# Patient Record
Sex: Male | Born: 1970 | Race: White | Hispanic: No | Marital: Married | Smoking: Former smoker
Health system: Southern US, Community
[De-identification: ages and names within clinical notes are randomized; demographics above are authoritative.]

## PROBLEM LIST (undated history)

## (undated) DIAGNOSIS — Z973 Presence of spectacles and contact lenses: Secondary | ICD-10-CM

## (undated) DIAGNOSIS — I10 Essential (primary) hypertension: Secondary | ICD-10-CM

## (undated) DIAGNOSIS — G4733 Obstructive sleep apnea (adult) (pediatric): Secondary | ICD-10-CM

## (undated) DIAGNOSIS — T7840XA Allergy, unspecified, initial encounter: Secondary | ICD-10-CM

## (undated) DIAGNOSIS — E785 Hyperlipidemia, unspecified: Secondary | ICD-10-CM

## (undated) DIAGNOSIS — N401 Enlarged prostate with lower urinary tract symptoms: Secondary | ICD-10-CM

## (undated) DIAGNOSIS — N529 Male erectile dysfunction, unspecified: Secondary | ICD-10-CM

## (undated) DIAGNOSIS — Z8619 Personal history of other infectious and parasitic diseases: Secondary | ICD-10-CM

## (undated) HISTORY — DX: Obstructive sleep apnea (adult) (pediatric): G47.33

## (undated) HISTORY — DX: Personal history of other infectious and parasitic diseases: Z86.19

## (undated) HISTORY — DX: Hyperlipidemia, unspecified: E78.5

## (undated) HISTORY — DX: Essential (primary) hypertension: I10

## (undated) HISTORY — DX: Allergy, unspecified, initial encounter: T78.40XA

---

## 2004-07-07 ENCOUNTER — Ambulatory Visit: Payer: Self-pay | Admitting: Internal Medicine

## 2005-05-28 ENCOUNTER — Ambulatory Visit: Payer: Self-pay | Admitting: Family Medicine

## 2006-01-03 ENCOUNTER — Ambulatory Visit: Payer: Self-pay | Admitting: Family Medicine

## 2006-02-02 ENCOUNTER — Ambulatory Visit: Payer: Self-pay | Admitting: Family Medicine

## 2006-04-13 ENCOUNTER — Ambulatory Visit: Payer: Self-pay | Admitting: Family Medicine

## 2006-06-10 ENCOUNTER — Encounter: Payer: Self-pay | Admitting: Internal Medicine

## 2006-06-20 ENCOUNTER — Ambulatory Visit: Payer: Self-pay | Admitting: Family Medicine

## 2006-06-20 DIAGNOSIS — I10 Essential (primary) hypertension: Secondary | ICD-10-CM

## 2006-06-20 DIAGNOSIS — E663 Overweight: Secondary | ICD-10-CM | POA: Insufficient documentation

## 2006-06-23 DIAGNOSIS — I10 Essential (primary) hypertension: Secondary | ICD-10-CM | POA: Insufficient documentation

## 2006-07-22 ENCOUNTER — Encounter (INDEPENDENT_AMBULATORY_CARE_PROVIDER_SITE_OTHER): Payer: Self-pay | Admitting: *Deleted

## 2006-07-25 ENCOUNTER — Ambulatory Visit: Payer: Self-pay | Admitting: Family Medicine

## 2006-07-25 DIAGNOSIS — F528 Other sexual dysfunction not due to a substance or known physiological condition: Secondary | ICD-10-CM

## 2006-07-28 LAB — CONVERTED CEMR LAB
Alkaline Phosphatase: 91 units/L (ref 39–117)
Calcium: 8.9 mg/dL (ref 8.4–10.5)
Chloride: 104 meq/L (ref 96–112)
Creatinine, Ser: 1.1 mg/dL (ref 0.4–1.5)
Eosinophils Absolute: 0.3 10*3/uL (ref 0.0–0.6)
Eosinophils Relative: 4.2 % (ref 0.0–5.0)
GFR calc Af Amer: 97 mL/min
Glucose, Bld: 74 mg/dL (ref 70–99)
LDL Cholesterol: 121 mg/dL — ABNORMAL HIGH (ref 0–99)
MCV: 85.2 fL (ref 78.0–100.0)
Platelets: 254 10*3/uL (ref 150–400)
Potassium: 3.5 meq/L (ref 3.5–5.1)
RBC: 5.19 M/uL (ref 4.22–5.81)
RDW: 12.7 % (ref 11.5–14.6)
Sodium: 139 meq/L (ref 135–145)
TSH: 1.39 microintl units/mL (ref 0.35–5.50)
Total Bilirubin: 1.1 mg/dL (ref 0.3–1.2)
Total CHOL/HDL Ratio: 4.5
Total Protein: 7 g/dL (ref 6.0–8.3)
Triglycerides: 77 mg/dL (ref 0–149)
VLDL: 15 mg/dL (ref 0–40)
WBC: 6 10*3/uL (ref 4.5–10.5)

## 2006-10-17 ENCOUNTER — Ambulatory Visit: Payer: Self-pay | Admitting: Family Medicine

## 2006-10-31 ENCOUNTER — Encounter (INDEPENDENT_AMBULATORY_CARE_PROVIDER_SITE_OTHER): Payer: Self-pay | Admitting: Internal Medicine

## 2006-10-31 ENCOUNTER — Ambulatory Visit: Payer: Self-pay | Admitting: Family Medicine

## 2006-10-31 ENCOUNTER — Encounter: Admission: RE | Admit: 2006-10-31 | Discharge: 2006-10-31 | Payer: Self-pay | Admitting: Sports Medicine

## 2006-10-31 DIAGNOSIS — IMO0002 Reserved for concepts with insufficient information to code with codable children: Secondary | ICD-10-CM

## 2006-11-03 ENCOUNTER — Telehealth (INDEPENDENT_AMBULATORY_CARE_PROVIDER_SITE_OTHER): Payer: Self-pay | Admitting: Internal Medicine

## 2006-11-28 ENCOUNTER — Ambulatory Visit: Payer: Self-pay | Admitting: Family Medicine

## 2007-03-07 ENCOUNTER — Ambulatory Visit: Payer: Self-pay | Admitting: Family Medicine

## 2007-03-20 ENCOUNTER — Ambulatory Visit: Payer: Self-pay | Admitting: Family Medicine

## 2007-03-20 DIAGNOSIS — J069 Acute upper respiratory infection, unspecified: Secondary | ICD-10-CM | POA: Insufficient documentation

## 2007-04-17 ENCOUNTER — Ambulatory Visit: Payer: Self-pay | Admitting: Family Medicine

## 2007-04-21 ENCOUNTER — Ambulatory Visit: Payer: Self-pay | Admitting: Internal Medicine

## 2007-04-21 DIAGNOSIS — R609 Edema, unspecified: Secondary | ICD-10-CM | POA: Insufficient documentation

## 2007-04-24 LAB — CONVERTED CEMR LAB
ALT: 46 units/L (ref 0–53)
AST: 25 units/L (ref 0–37)
Basophils Relative: 1 % (ref 0.0–1.0)
Bilirubin, Direct: 0.1 mg/dL (ref 0.0–0.3)
CO2: 26 meq/L (ref 19–32)
Calcium: 9.2 mg/dL (ref 8.4–10.5)
Chloride: 105 meq/L (ref 96–112)
Creatinine, Ser: 1 mg/dL (ref 0.4–1.5)
Eosinophils Absolute: 0.2 10*3/uL (ref 0.0–0.6)
Eosinophils Relative: 3.2 % (ref 0.0–5.0)
GFR calc non Af Amer: 89 mL/min
Glucose, Bld: 92 mg/dL (ref 70–99)
HCT: 44 % (ref 39.0–52.0)
MCV: 87 fL (ref 78.0–100.0)
Neutrophils Relative %: 49.9 % (ref 43.0–77.0)
Platelets: 233 10*3/uL (ref 150–400)
RBC: 5.05 M/uL (ref 4.22–5.81)
RDW: 12.7 % (ref 11.5–14.6)
Sodium: 138 meq/L (ref 135–145)
TSH: 0.86 microintl units/mL (ref 0.35–5.50)
Total Bilirubin: 0.9 mg/dL (ref 0.3–1.2)
Total Protein: 6.9 g/dL (ref 6.0–8.3)
WBC: 6.4 10*3/uL (ref 4.5–10.5)

## 2007-05-10 ENCOUNTER — Ambulatory Visit: Payer: Self-pay | Admitting: Family Medicine

## 2007-07-14 ENCOUNTER — Ambulatory Visit: Payer: Self-pay | Admitting: Family Medicine

## 2007-07-14 DIAGNOSIS — F329 Major depressive disorder, single episode, unspecified: Secondary | ICD-10-CM | POA: Insufficient documentation

## 2007-07-14 DIAGNOSIS — F3289 Other specified depressive episodes: Secondary | ICD-10-CM | POA: Insufficient documentation

## 2007-08-24 ENCOUNTER — Ambulatory Visit: Payer: Self-pay | Admitting: Family Medicine

## 2007-08-24 DIAGNOSIS — T6391XA Toxic effect of contact with unspecified venomous animal, accidental (unintentional), initial encounter: Secondary | ICD-10-CM | POA: Insufficient documentation

## 2007-09-11 ENCOUNTER — Telehealth (INDEPENDENT_AMBULATORY_CARE_PROVIDER_SITE_OTHER): Payer: Self-pay | Admitting: Internal Medicine

## 2007-10-17 ENCOUNTER — Emergency Department (HOSPITAL_COMMUNITY): Admission: EM | Admit: 2007-10-17 | Discharge: 2007-10-17 | Payer: Self-pay | Admitting: Emergency Medicine

## 2007-12-19 ENCOUNTER — Ambulatory Visit: Payer: Self-pay | Admitting: Family Medicine

## 2007-12-19 DIAGNOSIS — K1120 Sialoadenitis, unspecified: Secondary | ICD-10-CM

## 2009-09-15 ENCOUNTER — Encounter (INDEPENDENT_AMBULATORY_CARE_PROVIDER_SITE_OTHER): Payer: Self-pay | Admitting: *Deleted

## 2010-03-12 NOTE — Letter (Signed)
Summary: Nadara Eaton letter  [place] [facility]  [address] [place], [place] [address]   Phone: [phone]  Fax: [phone]       09/15/2009 MRN: [id number]  Juanita Nakayama 1955 [address] [place], [place]  [address]  Dear Mr. [facility] [place] and [place] announce the retirement of Arta Silence, M.D., from full-time practice at the [facility] office effective August 07, 2009 and his plans of returning part-time.  It is important to Dr. Hetty Ely and to our practice that you understand that [facility] has seven physicians in our office for your health care needs.  We will continue to offer the same exceptional care that you have today.    Dr. Hetty Ely has spoken to many of you about his plans for retirement and returning part-time in the fall.   We will continue to work with you through the transition to schedule appointments for you in the office and meet the high standards that [place] is committed to.   Again, it is with great pleasure that we share the news that Dr. Hetty Ely will return to [facility] at [facility] in October of 2011 with a reduced schedule.    If you have any questions, or would like to request an appointment with one of our physicians, please call us at [phone] and press the option for Scheduling an appointment.  We take pleasure in providing you with excellent patient care and look forward to seeing you at your next office visit.  Our [facility] Physicians are:  Tillman Abide, M.D. Laurita Quint, M.D. Roxy Manns, M.D. Kerby Nora, M.D. Hannah Beat, M.D. Ruthe Mannan, M.D. We proudly welcomed Raechel Ache, M.D. and Eustaquio Boyden, M.D. to the practice in July/August 2011.  Sincerely,   [facility]

## 2010-06-03 ENCOUNTER — Emergency Department (HOSPITAL_COMMUNITY)
Admission: EM | Admit: 2010-06-03 | Discharge: 2010-06-03 | Disposition: A | Discharge status: Home / Self Care | Payer: BC Managed Care – PPO | Attending: Emergency Medicine | Admitting: Emergency Medicine

## 2010-06-03 DIAGNOSIS — F411 Generalized anxiety disorder: Secondary | ICD-10-CM | POA: Insufficient documentation

## 2010-06-03 DIAGNOSIS — Y929 Unspecified place or not applicable: Secondary | ICD-10-CM | POA: Insufficient documentation

## 2010-06-03 DIAGNOSIS — I10 Essential (primary) hypertension: Secondary | ICD-10-CM | POA: Insufficient documentation

## 2010-06-03 DIAGNOSIS — R220 Localized swelling, mass and lump, head: Secondary | ICD-10-CM | POA: Insufficient documentation

## 2010-06-03 DIAGNOSIS — Z79899 Other long term (current) drug therapy: Secondary | ICD-10-CM | POA: Insufficient documentation

## 2010-06-03 DIAGNOSIS — R209 Unspecified disturbances of skin sensation: Secondary | ICD-10-CM | POA: Insufficient documentation

## 2010-06-03 DIAGNOSIS — T7840XA Allergy, unspecified, initial encounter: Secondary | ICD-10-CM | POA: Insufficient documentation

## 2010-06-03 LAB — POCT I-STAT, CHEM 8
Calcium, Ion: 1.13 mmol/L (ref 1.12–1.32)
Chloride: 105 mEq/L (ref 96–112)
Glucose, Bld: 90 mg/dL (ref 70–99)
HCT: 45 % (ref 39.0–52.0)
TCO2: 23 mmol/L (ref 0–100)

## 2011-08-24 ENCOUNTER — Ambulatory Visit (INDEPENDENT_AMBULATORY_CARE_PROVIDER_SITE_OTHER): Payer: BC Managed Care – PPO | Admitting: Family Medicine

## 2011-08-24 VITALS — BP 132/98 | HR 87 | Temp 98.9°F | Resp 17 | Ht 70.5 in | Wt 283.0 lb

## 2011-08-24 DIAGNOSIS — R5381 Other malaise: Secondary | ICD-10-CM

## 2011-08-24 DIAGNOSIS — R42 Dizziness and giddiness: Secondary | ICD-10-CM

## 2011-08-24 DIAGNOSIS — W57XXXA Bitten or stung by nonvenomous insect and other nonvenomous arthropods, initial encounter: Secondary | ICD-10-CM

## 2011-08-24 DIAGNOSIS — I10 Essential (primary) hypertension: Secondary | ICD-10-CM

## 2011-08-24 LAB — POCT CBC
Granulocyte percent: 68 %G (ref 37–80)
Hemoglobin: 16.3 g/dL (ref 14.1–18.1)
MCV: 89.8 fL (ref 80–97)
MID (cbc): 0.8 (ref 0–0.9)
MPV: 8.4 fL (ref 0–99.8)
Platelet Count, POC: 312 10*3/uL (ref 142–424)
RBC: 5.64 M/uL (ref 4.69–6.13)
WBC: 10.6 10*3/uL — AB (ref 4.6–10.2)

## 2011-08-24 LAB — BASIC METABOLIC PANEL
Chloride: 105 mEq/L (ref 96–112)
Potassium: 3.9 mEq/L (ref 3.5–5.3)
Sodium: 140 mEq/L (ref 135–145)

## 2011-08-24 MED ORDER — DOXYCYCLINE HYCLATE 100 MG PO TABS: 100.0000 mg | ORAL_TABLET | Freq: Two times a day (BID) | ORAL | Status: AC

## 2011-08-24 NOTE — Patient Instructions (Signed)
Increase fluid intake, start antibiotic for foot.  Recheck in the next few days if not improving. Return to the clinic or go to the nearest emergency room if any of your symptoms worsen or new symptoms occur. Your should receive a call or letter about your lab results within the next week to 10 days, but return for recheck if your symptoms are not improving in the next week.

## 2011-08-24 NOTE — Progress Notes (Signed)
Subjective:    Patient ID: Chris Carrillo, male    DOB: 20-Oct-1970, 41 y.o.   MRN: [id number]  HPI Chris Carrillo is a 41 y.o. male  Bit by tick June 20th - pulled off that day - there less than a day.  Noticed a red streak in area and bruising around it a day or two later.  Improved after topical cortisone.  Went on vacation. Now c/o lethargy for past 2 weeks, tired, lightheaded at times, but not keeping from daytime activities.  Notes if moving too fast or change positions. No chest pains.  On same dose lisinopril past 1 and 1/2 years.  Last provider for meds Chris Carrillo.  130's over 80's.   Stepped on glass  1 week ago. Small shard of glass at end of right foot.  Has had 2 small shards of glass removed, last one removed last night. Sore, swollen in area.   Review of Systems  Constitutional: Positive for fatigue. Negative for fever, chills and unexpected weight change.  Respiratory: Negative for chest tightness and shortness of breath.   Cardiovascular: Negative for chest pain, palpitations and leg swelling.  Skin: Positive for rash.  Neurological: Positive for dizziness (intermittent.). Negative for weakness.       Objective:   Physical Exam  Musculoskeletal:       Feet:  Skin: Skin is warm and dry. There is erythema.      Results for orders placed in visit on 08/24/11  POCT CBC      Component Value Range   WBC 10.6 (*) 4.6 - 10.2 K/uL   Lymph, poc 2.6  0.6 - 3.4   POC LYMPH PERCENT 24.5  10 - 50 %L   MID (cbc) 0.8  0 - 0.9   POC MID % 7.5  0 - 12 %M   POC Granulocyte 7.2 (*) 2 - 6.9   Granulocyte percent 68.0  37 - 80 %G   RBC 5.64  4.69 - 6.13 M/uL   Hemoglobin 16.3  14.1 - 18.1 g/dL   HCT, POC 40.9  81.1 - 53.7 %   MCV 89.8  80 - 97 fL   MCH, POC 28.9  27 - 31.2 pg   MCHC 32.2  31.8 - 35.4 g/dL   RDW, POC 91.4     Platelet Count, POC 312  142 - 424 K/uL   MPV 8.4  0 - 99.8 fL   EKG: Sr, nonspecific twave in III / inversion. No change from prior EKG in  2012.      Assessment & Plan:  Chris Carrillo is a 41 y.o. male 1. Other malaise and fatigue  POCT CBC, EKG 12-Lead, TSH, Basic metabolic panel, B. burgdorfi antibodies  2. Essential hypertension, benign  EKG 12-Lead, Basic metabolic panel  3. Dizziness and giddiness  POCT CBC, TSH, Basic metabolic panel, B. burgdorfi antibodies  4. Tick bite  POCT CBC, B. burgdorfi antibodies    Fatigue, intermittent dizziness - positional by hx.with prior tick exposure. underlying htn. No target lesions or migrans rash noted.  Check labs as above. If lyme titer positive - extend doxycycline to 14 days. Increase fluid intake, relative rest,  Recheck in next few days if not improving.   Wound - foot with prior glass exposure - soap/water cleanse QD - BID, bandage around with callous pad for pressure relief, doxycycline 100mg  BID # 20. Rtc/wound care precautions. Borderline leukocytosis - recheck in next few days of not improving, sooner if  fever.

## 2011-08-25 LAB — B. BURGDORFI ANTIBODIES: B burgdorferi Ab IgG+IgM: 0.22 {ISR}

## 2015-05-10 DIAGNOSIS — G4733 Obstructive sleep apnea (adult) (pediatric): Secondary | ICD-10-CM | POA: Insufficient documentation

## 2015-05-27 ENCOUNTER — Ambulatory Visit (HOSPITAL_BASED_OUTPATIENT_CLINIC_OR_DEPARTMENT_OTHER): Payer: BC Managed Care – PPO | Attending: Nurse Practitioner | Admitting: Internal Medicine

## 2015-05-27 VITALS — Ht 71.0 in | Wt 300.0 lb

## 2015-05-27 DIAGNOSIS — G4733 Obstructive sleep apnea (adult) (pediatric): Secondary | ICD-10-CM | POA: Diagnosis present

## 2015-05-27 DIAGNOSIS — R0683 Snoring: Secondary | ICD-10-CM | POA: Insufficient documentation

## 2015-06-04 ENCOUNTER — Other Ambulatory Visit (HOSPITAL_BASED_OUTPATIENT_CLINIC_OR_DEPARTMENT_OTHER): Payer: Self-pay

## 2015-06-04 DIAGNOSIS — G4733 Obstructive sleep apnea (adult) (pediatric): Secondary | ICD-10-CM

## 2015-06-08 ENCOUNTER — Encounter: Payer: Self-pay | Admitting: Family Medicine

## 2015-06-08 DIAGNOSIS — G4733 Obstructive sleep apnea (adult) (pediatric): Secondary | ICD-10-CM

## 2015-06-08 NOTE — Procedures (Signed)
Patient Name: Chris Carrillo, Chris Carrillo Date: 05/27/2015 Gender: Male D.O.B: [birth year] Age (years): 44 Referring Provider: Delia Chimes Height (inches): 71 Interpreting Physician: Baird Lyons MD, ABSM Weight (lbs): 300 RPSGT: Carolin Coy BMI: 42 MRN: [id number] Neck Size: 19.00 CLINICAL INFORMATION The patient is referred for a split night study with BPAP.  MEDICATIONS Medications taken by the patient : charted for review Medications administered by patient during sleep study : No sleep medicine administered.  SLEEP STUDY TECHNIQUE As per the AASM Manual for the Scoring of Sleep and Associated Events v2.3 (April 2016) with a hypopnea requiring 4% desaturations. The channels recorded and monitored were frontal, central and occipital EEG, electrooculogram (EOG), submentalis EMG (chin), nasal and oral airflow, thoracic and abdominal wall motion, anterior tibialis EMG, snore microphone, electrocardiogram, and pulse oximetry. Bi-level positive airway pressure (BiPAP) was initiated when the patient met split night criteria and was titrated according to treat sleep-disordered breathing.  RESPIRATORY PARAMETERS Diagnostic Total AHI (/hr): 79.8 RDI (/hr): 84.5 OA Index (/hr): 19.8 CA Index (/hr): 5.4 REM AHI (/hr): 76.8 NREM AHI (/hr): 80.4 Supine AHI (/hr): 80.8 Non-supine AHI (/hr): 81.17 Min O2 Sat (%): 78.00 Mean O2 (%): 94.30 Time below 88% (min): 12.7   Titration Optimal IPAP Pressure (cm): 21 Optimal EPAP Pressure (cm): 16 AHI at Optimal Pressure (/hr): 7.2 Min O2 at Optimal Pressure (%): 92.0 Sleep % at Optimal (%): 99 Supine % at Optimal (%): 0      SLEEP ARCHITECTURE The study was initiated at 10:35:39 PM and terminated at 5:29:05 AM. The total recorded time was 413.4 minutes. EEG confirmed total sleep time was 367.5 minutes yielding a sleep efficiency of 88.9%. Sleep onset after lights out was 13.1 minutes with a REM latency of 132.5 minutes. The patient spent 9.66% of the  night in stage N1 sleep, 61.63% in stage N2 sleep, 0.14% in stage N3 and 28.57% in REM. Wake after sleep onset (WASO) was 32.8 minutes. The Arousal Index was 42.3/hour.  LEG MOVEMENT DATA The total Periodic Limb Movements of Sleep (PLMS) were 0. The PLMS index was 0.00 .  CARDIAC DATA The 2 lead EKG demonstrated sinus rhythm. The mean heart rate was 49.95 beats per minute. Other EKG findings include: None.  IMPRESSIONS - Severe obstructive sleep apnea occurred during the diagnostic portion of the study (AHI = 79.8 /hour). An optimal BiPAP pressure was selected for this patient  (21/16 cm of water). CPAP provided inadequate control, so BIPAP titration was initiated per protocol. - Mild central sleep apnea occurred during the diagnostic portion of the study (CAI = 5.4/hour). - Moderate oxygen desaturation was noted during the diagnostic portion of the study (Min O2 = 78.00%). - The patient snored with Moderate snoring volume during the diagnostic portion of the study. - No cardiac abnormalities were noted during this study. - Clinically significant periodic limb movements of sleep did not occur during the study.  DIAGNOSIS - Obstructive Sleep Apnea (327.23 [G47.33 ICD-10])  RECOMMENDATIONS - Trial of BiPAP therapy on 21/16 cm H2O with a Medium size Resmed Full Face Mask AirFit F10 mask and heated humidification. - Avoid alcohol, sedatives and other CNS depressants that may worsen sleep apnea and disrupt normal sleep architecture. - Sleep hygiene should be reviewed to assess factors that may improve sleep quality. - Weight management and regular exercise should be initiated or continued.  Deneise Lever Diplomate, American Board of Sleep Medicine  ELECTRONICALLY SIGNED ON:  06/08/2015, 3:11 PM [place]: [phone]  [place]: [phone] [place]

## 2015-06-30 ENCOUNTER — Ambulatory Visit (HOSPITAL_BASED_OUTPATIENT_CLINIC_OR_DEPARTMENT_OTHER): Payer: BC Managed Care – PPO

## 2015-09-11 ENCOUNTER — Other Ambulatory Visit: Payer: Self-pay | Admitting: Nurse Practitioner

## 2015-09-11 ENCOUNTER — Ambulatory Visit
Admission: RE | Admit: 2015-09-11 | Discharge: 2015-09-11 | Disposition: A | Discharge status: Home / Self Care | Payer: BC Managed Care – PPO | Source: Ambulatory Visit | Attending: Nurse Practitioner | Admitting: Nurse Practitioner

## 2015-09-11 DIAGNOSIS — R600 Localized edema: Secondary | ICD-10-CM

## 2016-08-16 ENCOUNTER — Encounter: Payer: Self-pay | Admitting: Internal Medicine

## 2016-08-16 ENCOUNTER — Ambulatory Visit (INDEPENDENT_AMBULATORY_CARE_PROVIDER_SITE_OTHER): Payer: BC Managed Care – PPO | Admitting: Internal Medicine

## 2016-08-16 VITALS — BP 126/82 | HR 60 | Temp 98.3°F | Ht 71.0 in | Wt 310.0 lb

## 2016-08-16 DIAGNOSIS — E7800 Pure hypercholesterolemia, unspecified: Secondary | ICD-10-CM

## 2016-08-16 DIAGNOSIS — F325 Major depressive disorder, single episode, in full remission: Secondary | ICD-10-CM | POA: Diagnosis not present

## 2016-08-16 DIAGNOSIS — J301 Allergic rhinitis due to pollen: Secondary | ICD-10-CM

## 2016-08-16 DIAGNOSIS — N400 Enlarged prostate without lower urinary tract symptoms: Secondary | ICD-10-CM | POA: Diagnosis not present

## 2016-08-16 DIAGNOSIS — E785 Hyperlipidemia, unspecified: Secondary | ICD-10-CM | POA: Insufficient documentation

## 2016-08-16 DIAGNOSIS — I10 Essential (primary) hypertension: Secondary | ICD-10-CM

## 2016-08-16 DIAGNOSIS — N529 Male erectile dysfunction, unspecified: Secondary | ICD-10-CM | POA: Diagnosis not present

## 2016-08-16 DIAGNOSIS — J302 Other seasonal allergic rhinitis: Secondary | ICD-10-CM | POA: Insufficient documentation

## 2016-08-16 DIAGNOSIS — G4733 Obstructive sleep apnea (adult) (pediatric): Secondary | ICD-10-CM | POA: Diagnosis not present

## 2016-08-16 LAB — CBC
HCT: 42.4 % (ref 39.0–52.0)
Hemoglobin: 14.8 g/dL (ref 13.0–17.0)
MCHC: 34.8 g/dL (ref 30.0–36.0)
MCV: 85.6 fl (ref 78.0–100.0)
PLATELETS: 254 10*3/uL (ref 150.0–400.0)
RBC: 4.95 Mil/uL (ref 4.22–5.81)
RDW: 13.8 % (ref 11.5–15.5)
WBC: 5.5 10*3/uL (ref 4.0–10.5)

## 2016-08-16 LAB — COMPREHENSIVE METABOLIC PANEL
ALBUMIN: 4.1 g/dL (ref 3.5–5.2)
ALT: 32 U/L (ref 0–53)
AST: 19 U/L (ref 0–37)
Alkaline Phosphatase: 86 U/L (ref 39–117)
BILIRUBIN TOTAL: 0.7 mg/dL (ref 0.2–1.2)
BUN: 19 mg/dL (ref 6–23)
CALCIUM: 9.5 mg/dL (ref 8.4–10.5)
CO2: 27 meq/L (ref 19–32)
CREATININE: 1.19 mg/dL (ref 0.40–1.50)
Chloride: 103 mEq/L (ref 96–112)
GFR: 69.59 mL/min (ref >60.00)
Glucose, Bld: 98 mg/dL (ref 70–99)
Potassium: 3.9 mEq/L (ref 3.5–5.1)
Sodium: 136 mEq/L (ref 135–145)
Total Protein: 7 g/dL (ref 6.0–8.3)

## 2016-08-16 LAB — HEMOGLOBIN A1C: HEMOGLOBIN A1C: 5.5 % (ref 4.6–6.5)

## 2016-08-16 LAB — LIPID PANEL
CHOL/HDL RATIO: 3
Cholesterol: 136 mg/dL (ref 0–200)
HDL: 41.7 mg/dL (ref >39.00)
LDL Cholesterol: 81 mg/dL (ref 0–99)
NonHDL: 94.66
TRIGLYCERIDES: 68 mg/dL (ref 0.0–149.0)
VLDL: 13.6 mg/dL (ref 0.0–40.0)

## 2016-08-16 NOTE — Assessment & Plan Note (Signed)
CMET, lipid profile and A1C today

## 2016-08-16 NOTE — Assessment & Plan Note (Signed)
Encouraged weight loss He will continue CPAP for now

## 2016-08-16 NOTE — Assessment & Plan Note (Signed)
He is not interested in taking any medication for this at this time

## 2016-08-16 NOTE — Assessment & Plan Note (Signed)
Currently not an issue Will monitor 

## 2016-08-16 NOTE — Patient Instructions (Signed)

## 2016-08-16 NOTE — Assessment & Plan Note (Signed)
Controlled on Lasix, Losartan HCT and Metoprolol CMET today

## 2016-08-16 NOTE — Assessment & Plan Note (Signed)
-   Continue Flomax 

## 2016-08-16 NOTE — Assessment & Plan Note (Signed)
Continue OTC antihistamine as needed 

## 2016-08-16 NOTE — Progress Notes (Signed)
HPI  Pt presents to the clinic today to establish care and for management of the conditions listed below. He is transferring care from Tomi Bamberger, NP.  Seasonal Allergies: Worse in the spring and fall. He takes an antihistamine OTC as needed with good relief.  HLD: He reports he has not had his cholesterol checked in the last 2 years. He is no longer taking his cholesterol lowering medication. He tries to consume a low fat diet.  HTN: His BP today is 126/82. He is taking Lasix, Losartan-HCTZ and Metoprolol as prescribed. ECG from 08/2011 reviewed.  OSA: He averages 7-8 hours of sleep with the uses of CPAP. He follows with Dr. Maple Hudson.  Depression: He reports about 8 years ago, he was very stressed. He took medication for about 1 year and has not had any issues since that time.  ED: He has difficulty maintaining an erection. He has no  BPH: He reports he has an weak urine flow. He takes Flomax daily as prescribed.   Flu: 11/2014 Tetanus: > 10 years ago Vision Screening: annually Dentist: annually  Past Medical History:  Diagnosis Date  . Allergy   . History of chicken pox   . Hyperlipidemia   . Hypertension   . OSA (obstructive sleep apnea) 05/2015   severe - Trial of BiPAP therapy on 21/16 cm H2O with a Medium size Resmed Full Face Mask AirFit F10 mask and heated humidification ([place])    Current Outpatient Prescriptions  Medication Sig Dispense Refill  . furosemide (LASIX) 20 MG tablet Take 20 mg by mouth.    . losartan-hydrochlorothiazide (HYZAAR) 100-12.5 MG tablet Take 1 tablet by mouth daily.     . metoprolol succinate (TOPROL-XL) 100 MG 24 hr tablet Take 100 mg by mouth daily.     . tamsulosin (FLOMAX) 0.4 MG CAPS capsule Take 0.4 mg by mouth daily.     No current facility-administered medications for this visit.     No Known Allergies  Family History  Problem Relation Age of Onset  . Heart disease Mother   . Hypertension Father   . Miscarriages / Stillbirths  Maternal Grandmother   . Miscarriages / Stillbirths Paternal Grandmother     Social History   Social History  . Marital status: Married    Spouse name: N/A  . Number of children: N/A  . Years of education: N/A   Occupational History  . Not on file.   Social History Main Topics  . Smoking status: Never Smoker  . Smokeless tobacco: Former Neurosurgeon    Types: Snuff  . Alcohol use Yes     Comment: occasional  . Drug use: No  . Sexual activity: Not on file   Other Topics Concern  . Not on file   Social History Narrative  . No narrative on file    ROS:  Constitutional: Denies fever, malaise, fatigue, headache or abrupt weight changes.  HEENT: Denies eye pain, eye redness, ear pain, ringing in the ears, wax buildup, runny nose, nasal congestion, bloody nose, or sore throat. Respiratory: Denies difficulty breathing, shortness of breath, cough or sputum production.   Cardiovascular: Denies chest pain, chest tightness, palpitations or swelling in the hands or feet.  GU: Denies frequency, urgency, pain with urination, blood in urine, odor or discharge. Neurological: Denies dizziness, difficulty with memory, difficulty with speech or problems with balance and coordination.  Psych: Denies anxiety, depression, SI/HI.  No other specific complaints in a complete review of systems (except as listed in HPI  above).  PE:  BP 126/82   Pulse 60   Temp 98.3 F (36.8 C) (Oral)   Ht 5\' 11"  (1.803 m)   Wt (!) 310 lb (140.6 kg)   SpO2 98%   BMI 43.24 kg/m   Wt Readings from Last 3 Encounters:  08/16/16 (!) 310 lb (140.6 kg)  05/27/15 300 lb (136.1 kg)  08/24/11 283 lb (128.4 kg)    General: Appears his stated age, obese in NAD. Skin: Dry and intact. No ulcerations noted. HEENT: Head: Normal shape and size; Throat: Teeth present, mucosa pink and moist. No exudate, lesions or ulcerations noted. Neck: No thyromegaly noted. Cardiovascular: Normal rate and rhythm. S1,S2 noted.  No murmur,  rubs or gallops noted. No JVD or BLE edema.  Pulmonary/Chest: Normal effort and positive vesicular breath sounds. No respiratory distress. No wheezes, rales or ronchi noted.  Neurological: Alert and oriented.  Psychiatric: Mood and affect normal. Behavior is normal. Judgment and thought content normal.     BMET    Component Value Date/Time   NA 140 08/24/2011 1338   K 3.9 08/24/2011 1338   CL 105 08/24/2011 1338   CO2 24 08/24/2011 1338   GLUCOSE 87 08/24/2011 1338   BUN 19 08/24/2011 1338   CREATININE 1.24 08/24/2011 1338   CALCIUM 9.2 08/24/2011 1338   GFRNONAA 89 04/21/2007 1449   GFRAA 108 04/21/2007 1449    Lipid Panel     Component Value Date/Time   CHOL 175 07/25/2006 0912   TRIG 77 07/25/2006 0912   HDL 38.7 (L) 07/25/2006 0912   CHOLHDL 4.5 CALC 07/25/2006 0912   VLDL 15 07/25/2006 0912   LDLCALC 121 (H) 07/25/2006 0912    CBC    Component Value Date/Time   WBC 10.6 (A) 08/24/2011 1344   WBC 6.4 04/21/2007 1449   RBC 5.64 08/24/2011 1344   RBC 5.05 04/21/2007 1449   HGB 16.3 08/24/2011 1344   HGB 15.3 06/03/2010 1136   HCT 50.6 08/24/2011 1344   HCT 45.0 06/03/2010 1136   PLT 233 04/21/2007 1449   MCV 89.8 08/24/2011 1344   MCH 28.9 08/24/2011 1344   MCHC 32.2 08/24/2011 1344   MCHC 33.4 04/21/2007 1449   RDW 12.7 04/21/2007 1449   MONOABS 0.5 04/21/2007 1449   EOSABS 0.2 04/21/2007 1449   BASOSABS 0.1 04/21/2007 1449    Hgb A1C No results found for: HGBA1C   Assessment and Plan:

## 2016-09-22 ENCOUNTER — Encounter: Payer: Self-pay | Admitting: Internal Medicine

## 2016-09-24 ENCOUNTER — Other Ambulatory Visit: Payer: Self-pay

## 2016-09-24 MED ORDER — LOSARTAN POTASSIUM-HCTZ 100-12.5 MG PO TABS: 1.0000 | ORAL_TABLET | Freq: Every day | ORAL | 10 refills | Status: DC

## 2016-09-24 MED ORDER — METOPROLOL SUCCINATE ER 100 MG PO TB24: 100.0000 mg | ORAL_TABLET | Freq: Every day | ORAL | 10 refills | Status: DC

## 2016-09-24 NOTE — Telephone Encounter (Signed)
New pt for you, you have never filled medications... Please advise if okay to fill

## 2016-09-27 ENCOUNTER — Telehealth: Payer: Self-pay | Admitting: Internal Medicine

## 2016-09-27 ENCOUNTER — Telehealth: Payer: Self-pay | Admitting: Student

## 2016-09-27 NOTE — Telephone Encounter (Signed)
Spoke with pt and he stated that he will need a print out of his nightly usage of the cpap.  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  I advised him that he will need to contact his DME for this information.  He will call back for any issues.

## 2016-09-27 NOTE — Telephone Encounter (Signed)
Error-aj

## 2016-10-04 ENCOUNTER — Other Ambulatory Visit: Payer: Self-pay | Admitting: Internal Medicine

## 2016-10-04 NOTE — Telephone Encounter (Signed)
New pt, never filled ny you... Please advise

## 2017-02-28 ENCOUNTER — Other Ambulatory Visit: Payer: Self-pay | Admitting: Internal Medicine

## 2017-03-21 ENCOUNTER — Ambulatory Visit: Payer: BC Managed Care – PPO | Admitting: Pulmonary Disease

## 2017-03-21 ENCOUNTER — Encounter: Payer: Self-pay | Admitting: Pulmonary Disease

## 2017-03-21 VITALS — BP 142/80 | HR 98 | Ht 71.0 in | Wt 322.2 lb

## 2017-03-21 DIAGNOSIS — G4733 Obstructive sleep apnea (adult) (pediatric): Secondary | ICD-10-CM

## 2017-03-21 DIAGNOSIS — Z6841 Body Mass Index (BMI) 40.0 and over, adult: Secondary | ICD-10-CM

## 2017-03-21 NOTE — Progress Notes (Signed)
[facility]  Chief Complaint  Patient presents with  . sleep consult    self referral for OSA. 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 Pt has had cpap machine since April 2017, [facility]    Vital signs: BP (!) 142/80 (BP Location: Left Arm, Cuff Size: Normal)   Pulse 98   Ht 5\' 11"  (1.803 m)   Wt (!) 322 lb 3.2 oz (146.1 kg)   SpO2 98%   BMI 44.94 kg/m   History of Present Illness: Chris Carrillo is a 47 y.o. male for evaluation of sleep problems.  He had a sleep study set up with his PCP in April 2017.  He was found to have severe sleep apnea.  He was tried on CPAP during the study, but failed to improve.  He was transitioned to Bipap.  He has been using Bipap since.  [facility]  He uses full face mask.  He works with [employer] and needs medical clearance for his sleep apnea.  Prior to getting Bipap he would snore and stop breathing.  He would have frequent awakenings during the night, and feel tired during the day.  Since he started Bipap his sleep is much better and he no longer snores.  He goes to sleep at 10 pm.  He falls asleep quickly.  He sleeps through the night.  He gets out of bed at 640 am.  He feels rested in the morning.  He denies morning headache.  He does not use anything to help him fall sleep or stay awake.  He denies sleep walking, sleep talking, bruxism, or nightmares.  There is no history of restless legs.  He denies sleep hallucinations, sleep paralysis, or cataplexy.  The Epworth score is 3 out of 24.    Physical Exam:  General - pleasant Eyes - pupils reactive ENT - no sinus tenderness, no oral exudate, no LAN, MP 3 Cardiac - regular, no murmur Chest - no wheeze, rales Abd - soft, non tender Ext - no edema Skin - no rashes Neuro - normal strength Psych - normal mood  Discussion: He has history of severe obstructive sleep apnea.  He was  tried on CPAP during titration study, but failed to be adequately controlled.  He was tried Bipap and has done well with this.  We discussed how sleep apnea can affect various health problems, including risks for hypertension, cardiovascular disease, and diabetes.  We also discussed how sleep disruption can increase risks for accidents, such as while driving.  Weight loss as a means of improving sleep apnea was also reviewed.  Additional treatment options discussed were CPAP therapy, oral appliance, and surgical intervention.  Assessment/Plan:  Obstructive sleep apnea. - he is compliant with Bipap and reports benefit - will arrange for new mask and supplies - he will drop off form to complete for medical clearance for driving related to work   Patient Instructions  Will arrange for replacement Bipap mask and supplies  Drop off form for driving clearance and we will complete it  Follow up in 1 year    Coralyn HellingVineet Sydne Krahl, MD [facility] 03/21/2017, 12:18 PM Pager:  [phone]  Flow Sheet  Sleep tests: PSG 05/27/15 >> AHI 79.8, SpO2 low 78%.  Bipap 21/16 cm H2O. Bipap 12/21/16 to 03/20/17 >> used on 89 of 90 nights with average 7 hrs 17 min.  Average AHI 5.2  with Bipap 21/16 cm H2O.  Review of Systems: Constitutional: Negative for fever and unexpected weight change.  HENT: Negative for congestion, dental problem, ear pain, nosebleeds, postnasal drip, rhinorrhea, sinus pressure, sneezing, sore throat and trouble swallowing.   Eyes: Negative for redness and itching.  Respiratory: Positive for chest tightness. Negative for cough, shortness of breath and wheezing.   Cardiovascular: Negative for palpitations and leg swelling.  Gastrointestinal: Negative for nausea and vomiting.  Genitourinary: Positive for dysuria.  Musculoskeletal: Negative for joint swelling.  Skin: Negative for rash.  Allergic/Immunologic: Positive for environmental allergies. Negative for food  allergies and immunocompromised state.  Neurological: Negative for headaches.  Hematological: Does not bruise/bleed easily.  Psychiatric/Behavioral: Negative for dysphoric mood. The patient is not nervous/anxious.    Past Medical History: He  has a past medical history of Allergy, History of chicken pox, Hyperlipidemia, Hypertension, and OSA (obstructive sleep apnea) (05/2015).  Past Surgical History: He  has no past surgical history on file.  Family History: His family history includes Heart disease in his mother; Hypertension in his father; Miscarriages / [place] in his maternal grandmother and paternal grandmother.  Social History: He  reports that he quit smoking about 25 years ago. His smoking use included cigarettes. He has a 2.50 pack-year smoking history. He quit smokeless tobacco use about 2 years ago. His smokeless tobacco use included snuff. He reports that he drinks alcohol. He reports that he does not use drugs.  Medications: Allergies as of 03/21/2017   No Known Allergies     Medication List        Accurate as of 03/21/17 12:18 PM. Always use your most recent med list.          furosemide 20 MG tablet Commonly known as:  LASIX Take 20 mg by mouth.   losartan-hydrochlorothiazide 100-12.5 MG tablet Commonly known as:  HYZAAR Take 1 tablet by mouth daily.   metoprolol succinate 100 MG 24 hr tablet Commonly known as:  TOPROL-XL Take 1 tablet (100 mg total) by mouth at bedtime.   tamsulosin 0.4 MG Caps capsule Commonly known as:  FLOMAX TAKE ONE CAPSULE BY MOUTH DAILY

## 2017-03-21 NOTE — Patient Instructions (Signed)
Will arrange for replacement Bipap mask and supplies  Drop off form for driving clearance and we will complete it  Follow up in 1 year

## 2017-03-21 NOTE — Progress Notes (Signed)
   Subjective:    Patient ID: Chris Carrillo, male    DOB: 12/02/1970, 47 y.o.   MRN: [id number]  HPI    Review of Systems  Constitutional: Negative for fever and unexpected weight change.  HENT: Negative for congestion, dental problem, ear pain, nosebleeds, postnasal drip, rhinorrhea, sinus pressure, sneezing, sore throat and trouble swallowing.   Eyes: Negative for redness and itching.  Respiratory: Positive for chest tightness. Negative for cough, shortness of breath and wheezing.   Cardiovascular: Negative for palpitations and leg swelling.  Gastrointestinal: Negative for nausea and vomiting.  Genitourinary: Positive for dysuria.  Musculoskeletal: Negative for joint swelling.  Skin: Negative for rash.  Allergic/Immunologic: Positive for environmental allergies. Negative for food allergies and immunocompromised state.  Neurological: Negative for headaches.  Hematological: Does not bruise/bleed easily.  Psychiatric/Behavioral: Negative for dysphoric mood. The patient is not nervous/anxious.        Objective:   Physical Exam        Assessment & Plan:

## 2017-03-22 ENCOUNTER — Encounter: Payer: Self-pay | Admitting: Pulmonary Disease

## 2017-03-22 ENCOUNTER — Telehealth: Payer: Self-pay | Admitting: Pulmonary Disease

## 2017-03-22 NOTE — Telephone Encounter (Signed)
Instructions      Return in about 1 year (around 03/21/2018).  Will arrange for replacement Bipap mask and supplies  Drop off form for driving clearance and we will complete it  Follow up in 1 year     After Visit Summary (Printed 03/21/2017)

## 2017-03-22 NOTE — Telephone Encounter (Signed)
[facility]

## 2017-03-22 NOTE — Telephone Encounter (Signed)
ATC Jason, no answer. Left message for him to call back.

## 2017-03-23 NOTE — Telephone Encounter (Signed)
lmtcb x2 for Jason. 

## 2017-03-23 NOTE — Telephone Encounter (Signed)
Left voice mail on machine for patient to return phone call back regarding DMV paperwork is ready for pick up. X1

## 2017-03-24 NOTE — Telephone Encounter (Signed)
Noted. Will close this message.  

## 2017-03-24 NOTE — Telephone Encounter (Signed)
Patient calling back - advised DMV paperwork is ready for pick up - pt states he is near by and will come now to pick up - nothing further needed - 03/24/17-pr

## 2017-03-24 NOTE — Telephone Encounter (Signed)
lmtcb x2 for pt. 

## 2017-03-25 NOTE — Telephone Encounter (Signed)
lmtcb x3 for Chris Carrillo. 

## 2017-03-28 NOTE — Telephone Encounter (Signed)
[facility] with no success or call back. Per triage protocol, message will be closed.

## 2017-07-24 ENCOUNTER — Other Ambulatory Visit: Payer: Self-pay | Admitting: Internal Medicine

## 2017-08-12 IMAGING — CR DG CHEST 2V
2 series · 2 of 2 positions shown · non-contrast
Comparison: None.

CLINICAL DATA: Shortness of breath, fatigue, lower extremity edema
for 2 months

EXAM:
CHEST  2 VIEW

[w chest pa]
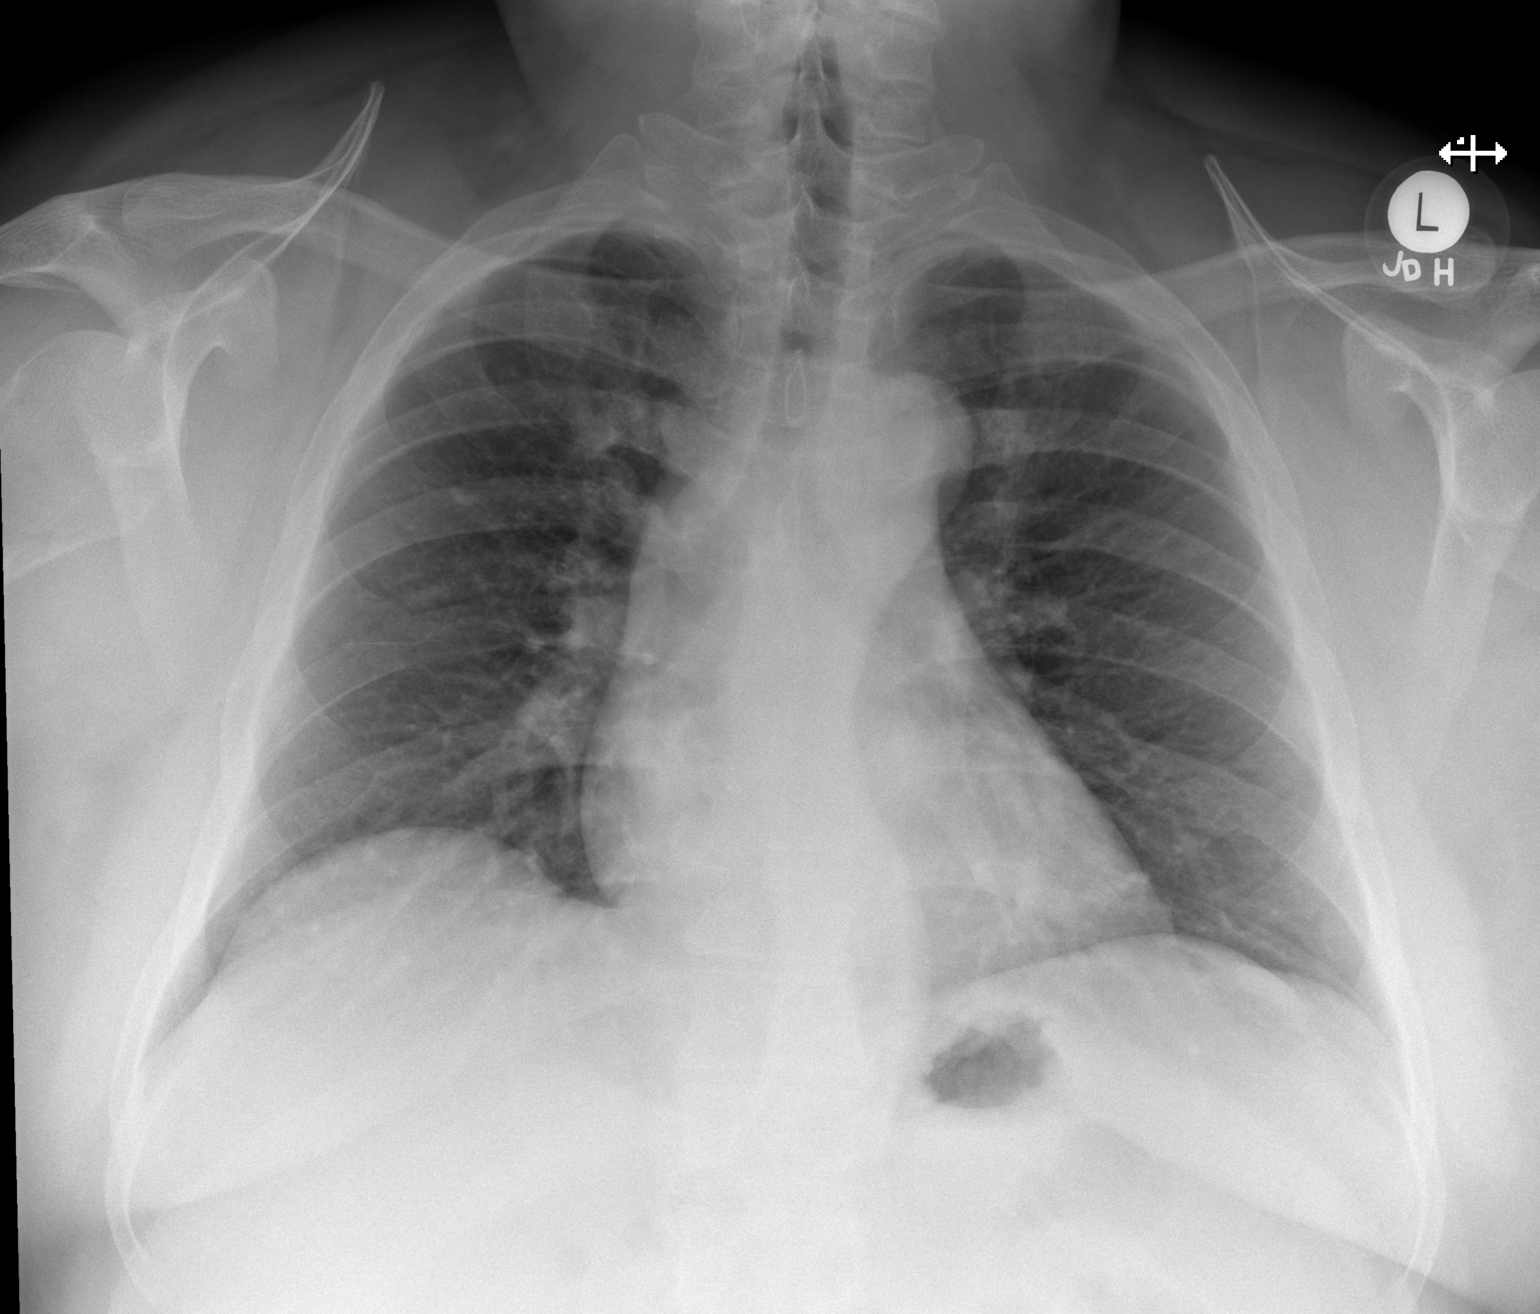

[w chest lat]
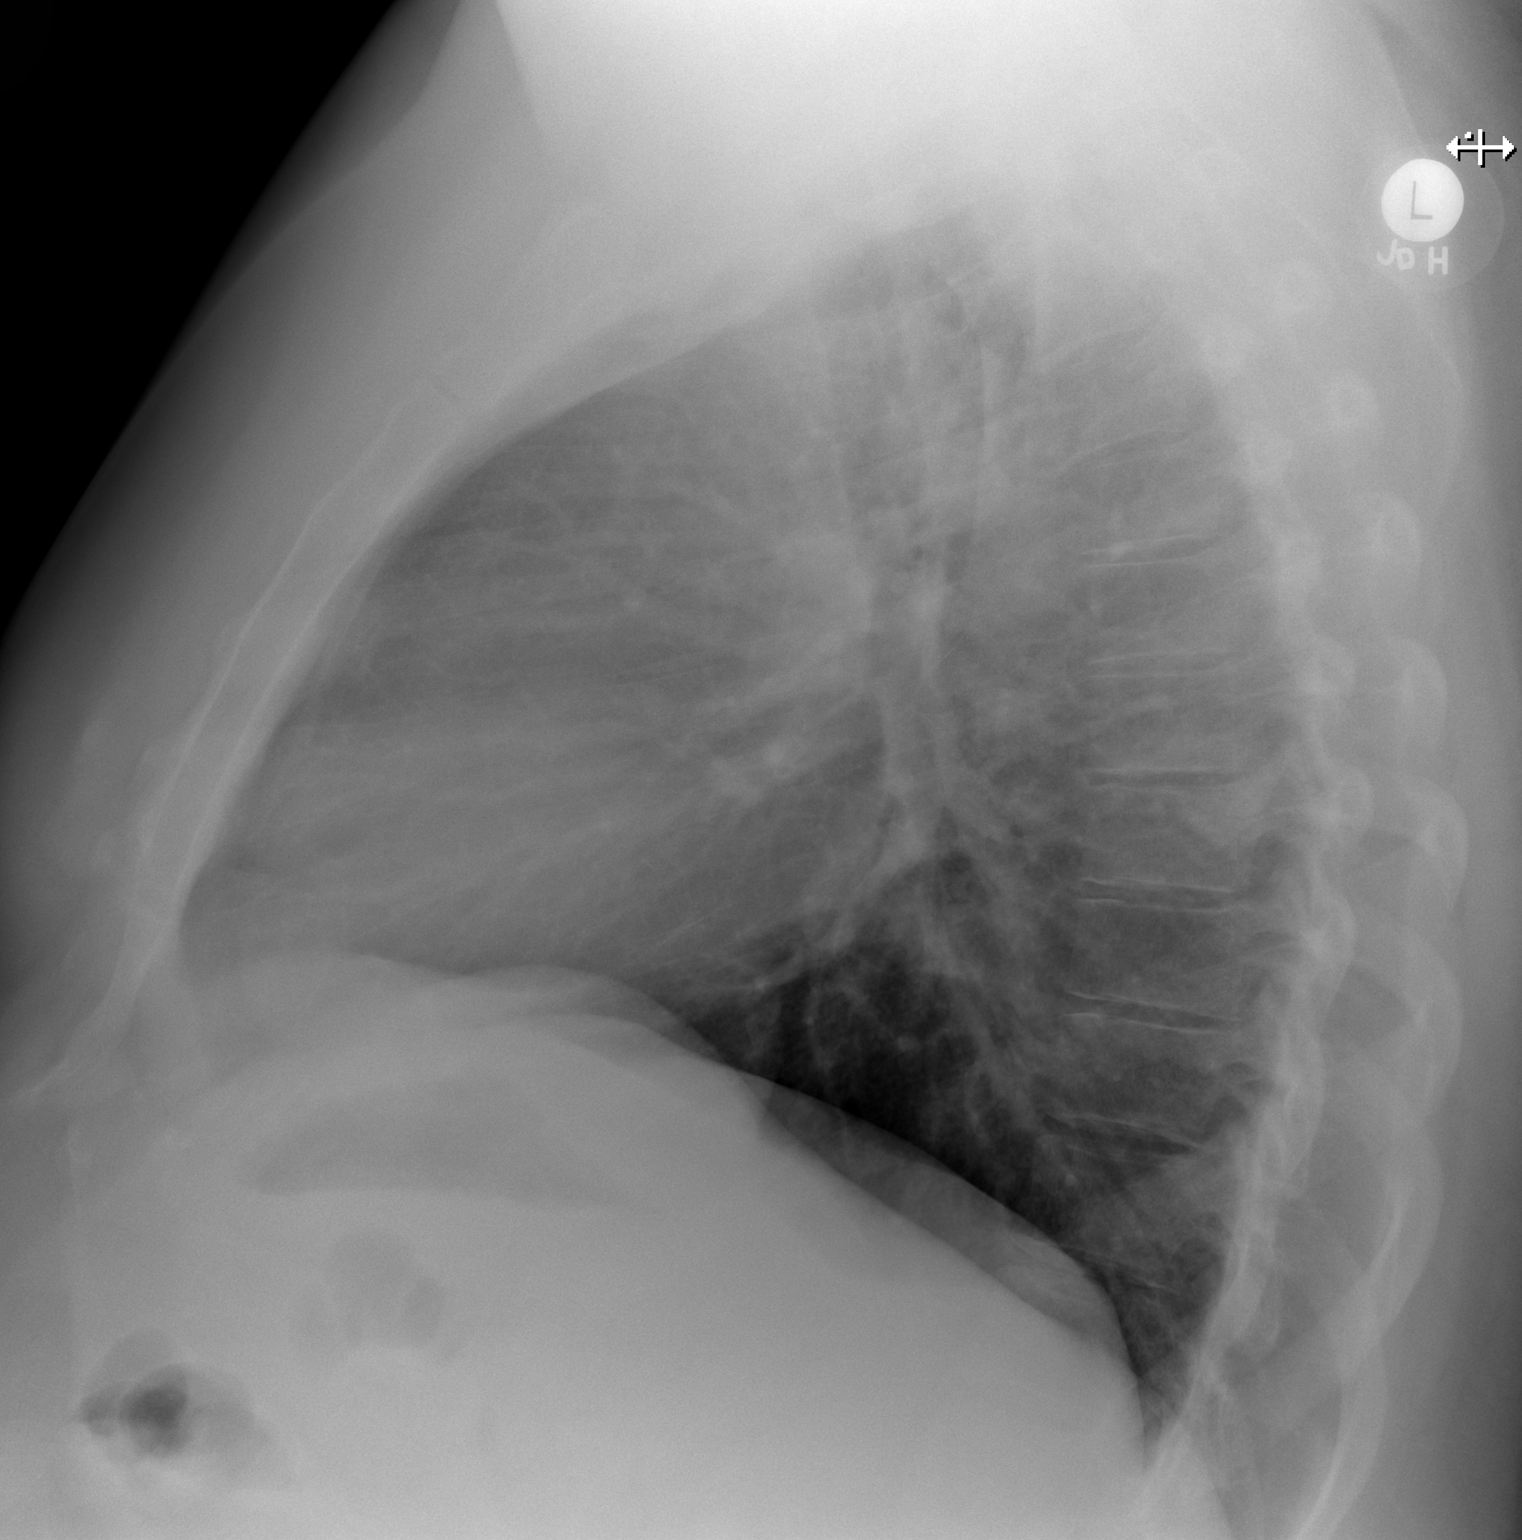

[2 of 2 positions shown; findings below may reference images not displayed]

FINDINGS: No active infiltrate or effusion is seen. There is no evidence of
congestive heart failure. No pleural effusion is noted. Mediastinal
and hilar contours are unremarkable. The heart is borderline
enlarged. The descending thoracic aorta is ectatic. No bony
abnormality is seen.
IMPRESSION: Borderline cardiomegaly.  No active lung disease.

## 2017-09-04 ENCOUNTER — Other Ambulatory Visit: Payer: Self-pay | Admitting: Internal Medicine

## 2017-09-22 ENCOUNTER — Other Ambulatory Visit: Payer: Self-pay | Admitting: Internal Medicine

## 2017-09-26 ENCOUNTER — Other Ambulatory Visit: Payer: Self-pay | Admitting: Internal Medicine

## 2017-11-03 ENCOUNTER — Other Ambulatory Visit: Payer: Self-pay | Admitting: Internal Medicine

## 2017-11-08 ENCOUNTER — Other Ambulatory Visit: Payer: Self-pay | Admitting: Internal Medicine

## 2017-11-10 NOTE — Telephone Encounter (Signed)
Pharmacy requesting alternative to be sent in for Losartan--HCTZ--- they are requesting what is already ordered... Please advise

## 2017-11-11 MED ORDER — TELMISARTAN-HCTZ 80-12.5 MG PO TABS: 1.0000 | ORAL_TABLET | Freq: Every day | ORAL | 0 refills | Status: DC

## 2017-11-11 NOTE — Addendum Note (Signed)
Addended by: Roena Malady on: 11/11/2017 01:18 PM   Modules accepted: Orders

## 2017-11-17 ENCOUNTER — Other Ambulatory Visit: Payer: Self-pay | Admitting: Internal Medicine

## 2017-11-18 MED ORDER — METOPROLOL SUCCINATE ER 100 MG PO TB24: 100.0000 mg | ORAL_TABLET | Freq: Every day | ORAL | 0 refills | Status: DC

## 2017-11-18 NOTE — Addendum Note (Signed)
Addended by: Winford Hehn Y on: 11/18/2017 08:28 AM   Modules accepted: Orders  

## 2017-11-26 ENCOUNTER — Other Ambulatory Visit: Payer: Self-pay | Admitting: Internal Medicine

## 2017-11-29 MED ORDER — TELMISARTAN-HCTZ 80-12.5 MG PO TABS: 1.0000 | ORAL_TABLET | Freq: Every day | ORAL | 0 refills | Status: DC

## 2017-11-29 NOTE — Addendum Note (Signed)
Addended by: Roena Malady on: 11/29/2017 02:07 PM   Modules accepted: Orders

## 2019-12-10 HISTORY — PX: OTHER SURGICAL HISTORY: SHX169

## 2020-01-16 HISTORY — PX: OTHER SURGICAL HISTORY: SHX169

## 2020-04-09 ENCOUNTER — Other Ambulatory Visit: Payer: Self-pay | Admitting: Urology

## 2020-04-24 ENCOUNTER — Other Ambulatory Visit: Payer: Self-pay

## 2020-04-24 ENCOUNTER — Encounter (HOSPITAL_BASED_OUTPATIENT_CLINIC_OR_DEPARTMENT_OTHER): Payer: Self-pay | Admitting: Urology

## 2020-04-24 NOTE — Progress Notes (Signed)
Spoke w/ via phone for pre-op interview--- PT Lab needs dos----  Istat  And EKG             Lab results------ no COVID test ------ 04-25-2020 @ 1430 Arrive at ------- 0800 on 04-29-2020 NPO after MN NO Solid Food.  Clear liquids from MN until--- 0700 Med rec completed Medications to take morning of surgery ----- Zoloft Diabetic medication ----- n/a Patient instructed to bring photo id and insurance card day of surgery Patient aware to have Driver (ride ) / caregiver    for 24 hours after surgery --- wife, Chris Carrillo Patient Special Instructions ----- reviewed [facility] and visitor guidelines. Asked pt to bring cpap/ mask/ tubing Pre-Op special Istructions ----- n/a Patient verbalized understanding of instructions that were given at this phone interview. Patient denies shortness of breath, chest pain, fever, cough at this phone interview.

## 2020-04-25 ENCOUNTER — Other Ambulatory Visit (HOSPITAL_COMMUNITY)
Admission: RE | Admit: 2020-04-25 | Discharge: 2020-04-25 | Disposition: A | Discharge status: Home / Self Care | Payer: BC Managed Care – PPO | Source: Ambulatory Visit | Attending: Urology | Admitting: Urology

## 2020-04-25 DIAGNOSIS — Z20822 Contact with and (suspected) exposure to covid-19: Secondary | ICD-10-CM | POA: Insufficient documentation

## 2020-04-25 DIAGNOSIS — Z01812 Encounter for preprocedural laboratory examination: Secondary | ICD-10-CM | POA: Insufficient documentation

## 2020-04-26 LAB — SARS CORONAVIRUS 2 (TAT 6-24 HRS): SARS Coronavirus 2: NEGATIVE

## 2020-04-28 MED ORDER — DEXTROSE 5 % IV SOLN
3.0000 g | INTRAVENOUS | Status: AC
  Administered 2020-04-29: 3 g via INTRAVENOUS
  Filled 2020-04-28: qty 3

## 2020-04-29 ENCOUNTER — Ambulatory Visit (HOSPITAL_BASED_OUTPATIENT_CLINIC_OR_DEPARTMENT_OTHER)
Admission: RE | Admit: 2020-04-29 | Discharge: 2020-04-29 | Disposition: A | Discharge status: Home / Self Care | Payer: BC Managed Care – PPO | Attending: Urology | Admitting: Urology

## 2020-04-29 ENCOUNTER — Other Ambulatory Visit: Payer: Self-pay

## 2020-04-29 ENCOUNTER — Encounter (HOSPITAL_BASED_OUTPATIENT_CLINIC_OR_DEPARTMENT_OTHER): Payer: Self-pay | Admitting: Urology

## 2020-04-29 ENCOUNTER — Encounter (HOSPITAL_BASED_OUTPATIENT_CLINIC_OR_DEPARTMENT_OTHER)
Admission: RE | Disposition: A | Discharge status: Home / Self Care | Payer: Self-pay | Source: Home / Self Care | Attending: Urology

## 2020-04-29 ENCOUNTER — Ambulatory Visit (HOSPITAL_BASED_OUTPATIENT_CLINIC_OR_DEPARTMENT_OTHER): Payer: BC Managed Care – PPO | Admitting: Anesthesiology

## 2020-04-29 DIAGNOSIS — N138 Other obstructive and reflux uropathy: Secondary | ICD-10-CM | POA: Diagnosis not present

## 2020-04-29 DIAGNOSIS — N320 Bladder-neck obstruction: Secondary | ICD-10-CM | POA: Insufficient documentation

## 2020-04-29 DIAGNOSIS — I10 Essential (primary) hypertension: Secondary | ICD-10-CM | POA: Insufficient documentation

## 2020-04-29 DIAGNOSIS — Z87891 Personal history of nicotine dependence: Secondary | ICD-10-CM | POA: Insufficient documentation

## 2020-04-29 DIAGNOSIS — Z8249 Family history of ischemic heart disease and other diseases of the circulatory system: Secondary | ICD-10-CM | POA: Diagnosis not present

## 2020-04-29 DIAGNOSIS — N401 Enlarged prostate with lower urinary tract symptoms: Secondary | ICD-10-CM | POA: Diagnosis not present

## 2020-04-29 DIAGNOSIS — R351 Nocturia: Secondary | ICD-10-CM | POA: Diagnosis not present

## 2020-04-29 DIAGNOSIS — Z8052 Family history of malignant neoplasm of bladder: Secondary | ICD-10-CM | POA: Insufficient documentation

## 2020-04-29 HISTORY — DX: Benign prostatic hyperplasia with lower urinary tract symptoms: N40.1

## 2020-04-29 HISTORY — DX: Obstructive sleep apnea (adult) (pediatric): G47.33

## 2020-04-29 HISTORY — PX: TRANSURETHRAL RESECTION OF PROSTATE: SHX73

## 2020-04-29 HISTORY — DX: Presence of spectacles and contact lenses: Z97.3

## 2020-04-29 LAB — POCT I-STAT, CHEM 8
BUN: 23 mg/dL — ABNORMAL HIGH (ref 6–20)
Calcium, Ion: 1.23 mmol/L (ref 1.15–1.40)
Chloride: 101 mmol/L (ref 98–111)
Creatinine, Ser: 1.1 mg/dL (ref 0.61–1.24)
Glucose, Bld: 90 mg/dL (ref 70–99)
HCT: 44 % (ref 39.0–52.0)
Hemoglobin: 15 g/dL (ref 13.0–17.0)
Potassium: 3.4 mmol/L — ABNORMAL LOW (ref 3.5–5.1)
Sodium: 141 mmol/L (ref 135–145)
TCO2: 31 mmol/L (ref 22–32)

## 2020-04-29 SURGERY — TURP (TRANSURETHRAL RESECTION OF PROSTATE)
Anesthesia: General | Site: Prostate

## 2020-04-29 MED ORDER — FENTANYL CITRATE (PF) 100 MCG/2ML IJ SOLN
INTRAMUSCULAR | Status: AC
  Filled 2020-04-29: qty 2

## 2020-04-29 MED ORDER — KETOROLAC TROMETHAMINE 30 MG/ML IJ SOLN: 30.0000 mg | Freq: Once | INTRAMUSCULAR | Status: DC | PRN

## 2020-04-29 MED ORDER — CEFAZOLIN SODIUM-DEXTROSE 2-4 GM/100ML-% IV SOLN
INTRAVENOUS | Status: AC
  Filled 2020-04-29: qty 100

## 2020-04-29 MED ORDER — MIDAZOLAM HCL 2 MG/2ML IJ SOLN
INTRAMUSCULAR | Status: DC | PRN
  Administered 2020-04-29: 2 mg via INTRAVENOUS

## 2020-04-29 MED ORDER — ONDANSETRON HCL 4 MG/2ML IJ SOLN
INTRAMUSCULAR | Status: DC | PRN
  Administered 2020-04-29: 4 mg via INTRAVENOUS

## 2020-04-29 MED ORDER — STERILE WATER FOR IRRIGATION IR SOLN
Status: DC | PRN
Start: 2020-04-29 — End: 2020-04-29
  Administered 2020-04-29: 500 mL

## 2020-04-29 MED ORDER — PROPOFOL 10 MG/ML IV BOLUS
INTRAVENOUS | Status: AC
  Filled 2020-04-29: qty 40

## 2020-04-29 MED ORDER — LIDOCAINE 2% (20 MG/ML) 5 ML SYRINGE
INTRAMUSCULAR | Status: DC | PRN
  Administered 2020-04-29: 100 mg via INTRAVENOUS

## 2020-04-29 MED ORDER — LIDOCAINE 2% (20 MG/ML) 5 ML SYRINGE
INTRAMUSCULAR | Status: AC
  Filled 2020-04-29: qty 5

## 2020-04-29 MED ORDER — SODIUM CHLORIDE 0.9 % IR SOLN
Status: DC | PRN
  Administered 2020-04-29: 3000 mL
  Administered 2020-04-29: 6000 mL
  Administered 2020-04-29: 3000 mL

## 2020-04-29 MED ORDER — ACETAMINOPHEN 10 MG/ML IV SOLN
INTRAVENOUS | Status: DC | PRN
  Administered 2020-04-29: 1000 mg via INTRAVENOUS

## 2020-04-29 MED ORDER — MEPERIDINE HCL 25 MG/ML IJ SOLN: 6.2500 mg | INTRAMUSCULAR | Status: DC | PRN

## 2020-04-29 MED ORDER — PROMETHAZINE HCL 25 MG/ML IJ SOLN: 6.2500 mg | INTRAMUSCULAR | Status: DC | PRN

## 2020-04-29 MED ORDER — ACETAMINOPHEN 10 MG/ML IV SOLN
INTRAVENOUS | Status: AC
  Filled 2020-04-29: qty 100

## 2020-04-29 MED ORDER — FENTANYL CITRATE (PF) 100 MCG/2ML IJ SOLN
INTRAMUSCULAR | Status: DC | PRN
  Administered 2020-04-29 (×6): 50 ug via INTRAVENOUS

## 2020-04-29 MED ORDER — MIDAZOLAM HCL 2 MG/2ML IJ SOLN
INTRAMUSCULAR | Status: AC
  Filled 2020-04-29: qty 2

## 2020-04-29 MED ORDER — DEXAMETHASONE SODIUM PHOSPHATE 10 MG/ML IJ SOLN
INTRAMUSCULAR | Status: DC | PRN
  Administered 2020-04-29: 10 mg via INTRAVENOUS

## 2020-04-29 MED ORDER — LACTATED RINGERS IV SOLN: INTRAVENOUS | Status: DC

## 2020-04-29 MED ORDER — HYDROMORPHONE HCL 1 MG/ML IJ SOLN: 0.2500 mg | INTRAMUSCULAR | Status: DC | PRN

## 2020-04-29 MED ORDER — CEFAZOLIN SODIUM-DEXTROSE 1-4 GM/50ML-% IV SOLN
INTRAVENOUS | Status: AC
  Filled 2020-04-29: qty 50

## 2020-04-29 MED ORDER — ONDANSETRON HCL 4 MG/2ML IJ SOLN
INTRAMUSCULAR | Status: AC
  Filled 2020-04-29: qty 2

## 2020-04-29 MED ORDER — DEXAMETHASONE SODIUM PHOSPHATE 10 MG/ML IJ SOLN
INTRAMUSCULAR | Status: AC
  Filled 2020-04-29: qty 1

## 2020-04-29 MED ORDER — HYDROCODONE-ACETAMINOPHEN 5-325 MG PO TABS: 1.0000 | ORAL_TABLET | ORAL | 0 refills | Status: DC | PRN

## 2020-04-29 MED ORDER — PROPOFOL 10 MG/ML IV BOLUS
INTRAVENOUS | Status: DC | PRN
  Administered 2020-04-29: 300 mg via INTRAVENOUS
  Administered 2020-04-29: 20 mg via INTRAVENOUS

## 2020-04-29 SURGICAL SUPPLY — 24 items
BAG DRAIN URO-CYSTO SKYTR STRL (DRAIN) ×2 IMPLANT
BAG DRN RND TRDRP ANRFLXCHMBR (UROLOGICAL SUPPLIES) ×1
BAG DRN UROCATH (DRAIN) ×1
BAG URINE DRAIN 2000ML AR STRL (UROLOGICAL SUPPLIES) ×2 IMPLANT
CATH FOLEY 3WAY 30CC 22FR (CATHETERS) ×4 IMPLANT
CATH HEMA 3WAY 30CC 22FR COUDE (CATHETERS) ×2 IMPLANT
CLOTH BEACON ORANGE TIMEOUT ST (SAFETY) ×2 IMPLANT
ELECT REM PT RETURN 9FT ADLT (ELECTROSURGICAL) ×2
ELECTRODE REM PT RTRN 9FT ADLT (ELECTROSURGICAL) ×1 IMPLANT
GLOVE SURG ENC MOIS LTX SZ6.5 (GLOVE) ×2 IMPLANT
GOWN STRL REUS W/TWL LRG LVL3 (GOWN DISPOSABLE) ×2 IMPLANT
HOLDER FOLEY CATH W/STRAP (MISCELLANEOUS) ×2 IMPLANT
IV NS IRRIG 3000ML ARTHROMATIC (IV SOLUTION) ×10 IMPLANT
KIT TURNOVER CYSTO (KITS) ×2 IMPLANT
LOOP CUT BIPOLAR 24F LRG (ELECTROSURGICAL) ×2 IMPLANT
MANIFOLD NEPTUNE II (INSTRUMENTS) ×2 IMPLANT
PACK CYSTO (CUSTOM PROCEDURE TRAY) ×2 IMPLANT
PLUG CATH AND CAP STER (CATHETERS) ×2 IMPLANT
SYR 30ML LL (SYRINGE) ×2 IMPLANT
SYR TOOMEY IRRIG 70ML (MISCELLANEOUS) ×2
SYRINGE TOOMEY IRRIG 70ML (MISCELLANEOUS) ×1 IMPLANT
TUBE CONNECTING 12X1/4 (SUCTIONS) ×2 IMPLANT
TUBING UROLOGY SET (TUBING) ×2 IMPLANT
WATER STERILE IRR 500ML POUR (IV SOLUTION) ×2 IMPLANT

## 2020-04-29 NOTE — Interval H&P Note (Signed)
History and Physical Interval Note:  04/29/2020 9:18 AM  Chris Carrillo  has presented today for surgery, with the diagnosis of BENIGN PROSTATIC HYPERPLASIA W/ LOWER URINARY TRACT SYMPTOMS.  The various methods of treatment have been discussed with the patient and family. After consideration of risks, benefits and other options for treatment, the patient has consented to  Procedure(s) with comments: TRANSURETHRAL RESECTION OF THE PROSTATE (TURP) (N/A) - 90 MINS as a surgical intervention.  The patient's history has been reviewed, patient examined, no change in status, stable for surgery.  I have reviewed the patient's chart and labs.  Questions were answered to the patient's satisfaction.     [name] D Demarea Lorey

## 2020-04-29 NOTE — H&P (Signed)
CC/HPI: cc: BPH with LUTs   04/03/20: 50 year old man with hx of BPH with LUTs s/p Rezum 01/07/2020. He had a total of 5 treatment with 1 in his medial lobe. He tolerated the procedure well. He passed his voiding trial on 01/15/2020. Patient did well for about a month after the procedure however then started developing worsening urinary symptoms and weaker stream. He is also having significant urgency and frequency. He has been using Uribel on and off.     ALLERGIES: None   MEDICATIONS: Tamsulosin Hcl 0.4 mg capsule  Uro-Mp 118 mg-10 mg-40.8 mg-36 mg-0.12 mg capsule TAKE 1 CAPSULE BY MOUTH THREE TIMES A DAY AS NEEDED  Hydroxyzine Hcl 25 mg tablet  Losartan-Hydrochlorothiazide 50 mg-12.5 mg tablet  Sertraline Hcl 25 mg tablet  Tadalafil 5 mg tablet 1 tablet PO Daily  Uribel 118 mg-10 mg-40.8 mg-36 mg-0.12 mg capsule 1 capsule PO TID PRN  Zoloft     GU PSH: Cystoscopy - 10/23/2019 Locm 300-399Mg /Ml Iodine,1Ml - 10/09/2019 Trurl Dstrj Prst8 Tiss Rf Wv - 01/07/2020       PSH Notes: left knee surgery   NON-GU PSH: None   GU PMH: BPH w/LUTS - 02/27/2020, - 02/12/2020, - 02/04/2020, - 01/15/2020, - 01/07/2020, - 11/21/2019, - 10/23/2019, - 09/27/2019 Dysuria - 02/27/2020, - 02/20/2020, - 02/12/2020 Nocturia - 02/27/2020, - 09/27/2019 Urinary Urgency - 02/27/2020, - 02/20/2020, - 02/12/2020, - 10/23/2019, - 09/27/2019 Ureteral calculus - 02/20/2020, - 10/23/2019 Urinary Frequency - 02/20/2020, - 02/12/2020, - 10/23/2019, - 09/27/2019 Incomplete bladder emptying - 01/07/2020, - 10/23/2019, - 09/27/2019 Weak Urinary Stream - 01/07/2020, - 10/23/2019, - 09/27/2019 ED due to arterial insufficiency - 10/23/2019, - 09/27/2019 Microscopic hematuria - 10/23/2019, - 09/27/2019 Encounter for Prostate Cancer screening - 09/27/2019 Urinary Hesitancy - 09/27/2019      PMH Notes: C-PAP   NON-GU PMH: Anxiety Arthritis Depression Hypertension Sleep Apnea    FAMILY HISTORY: Bladder Cancer - Father cardiac pacemaker -  Mother Enlarged prostate - Father Heart Disease - Mother Kidney Stones - Father   SOCIAL HISTORY: Marital Status: Married Preferred Language: English; Race: White Current Smoking Status: Patient does not smoke anymore. Has not smoked since 09/09/1990. Smoked for 3 years. Smoked 1 pack per day.   Tobacco Use Assessment Completed: Used Tobacco in last 30 days? Drinks 1 drink per day.  Drinks 1 caffeinated drink per day. Has not had a blood transfusion. Patient's occupation Programmer, [employer] retired.     Notes: 1 son 1 daughter Pt dips   REVIEW OF SYSTEMS:    GU Review Male:   Patient denies frequent urination, hard to postpone urination, burning/ pain with urination, get up at night to urinate, leakage of urine, stream starts and stops, trouble starting your stream, have to strain to urinate , erection problems, and penile pain.  Gastrointestinal (Upper):   Patient denies nausea, vomiting, and indigestion/ heartburn.  Gastrointestinal (Lower):   Patient denies diarrhea and constipation.  Constitutional:   Patient denies fever, night sweats, weight loss, and fatigue.  Skin:   Patient denies skin rash/ lesion and itching.  Eyes:   Patient denies blurred vision and double vision.  Ears/ Nose/ Throat:   Patient denies sore throat and sinus problems.  Hematologic/Lymphatic:   Patient denies swollen glands and easy bruising.  Cardiovascular:   Patient denies leg swelling and chest pains.  Respiratory:   Patient denies cough and shortness of breath.  Endocrine:   Patient denies excessive thirst.  Musculoskeletal:   Patient denies back pain and joint  pain.  Neurological:   Patient denies headaches and dizziness.  Psychologic:   Patient denies anxiety and depression.   VITAL SIGNS:      04/03/2020 02:57 PM  Weight 300 lb / 136.08 kg  Height 71 in / 180.34 cm  BP 136/91 mmHg  Pulse 85 /min  Temperature 97.8 F / 36.5 C  BMI 41.8 kg/m   MULTI-SYSTEM PHYSICAL EXAMINATION:     Constitutional: Well-nourished. No physical deformities. Normally developed. Good grooming.  Neck: Neck symmetrical, not swollen. Normal tracheal position.  Respiratory: No labored breathing, no use of accessory muscles.   Skin: No paleness, no jaundice, no cyanosis. No lesion, no ulcer, no rash.  Neurologic / Psychiatric: Oriented to time, oriented to place, oriented to person. No depression, no anxiety, no agitation.  Gastrointestinal: No rigidity, non obese abdomen.   Eyes: Normal conjunctivae. Normal eyelids.  Ears, Nose, Mouth, and Throat: Left ear no scars, no lesions, no masses. Right ear no scars, no lesions, no masses. Nose no scars, no lesions, no masses. Normal hearing. Normal lips.  Musculoskeletal: Normal gait and station of head and neck.     Complexity of Data:  Records Review:   Previous Patient Records, POC Tool  Urine Test Review:   Urinalysis  Urodynamics Review:   Review Bladder Scan   09/27/19  PSA  Total PSA 2.79 ng/mL   Notes:                     09/27/2019: PSA 2.7   PROCEDURES:         Flexible Cystoscopy - 52000  Risks, benefits, and some of the potential complications of the procedure were discussed at length with the patient including infection, bleeding, voiding discomfort, urinary retention, fever, chills, sepsis, and others. All questions were answered. Informed consent was obtained. Sterile technique and intraurethral analgesia were used.  Meatus:  Normal size. Normal location. Normal condition.  Urethra:  No strictures.  External Sphincter:  Normal.  Verumontanum:  Normal.  Prostate:  Right lateral lobe has responded well to rezum treatment however left lateral lobe appears to be floppy/obstructing bladder neck  Bladder Neck:  Non-obstructing.  Ureteral Orifices:  Normal location. Normal size. Normal shape. Effluxed clear urine.  Bladder:  No trabeculation. No tumors. Normal mucosa. No stones.      The lower urinary tract was carefully examined.  The procedure was well-tolerated and without complications. Antibiotic instructions were given. Instructions were given to call the office immediately for bloody urine, difficulty urinating, urinary retention, painful or frequent urination, fever, chills, nausea, vomiting or other illness. The patient stated that he understood these instructions and would comply with them.        PVR Ultrasound - 34193  Scanned Volume: 193 cc         Urinalysis w/Scope Dipstick Dipstick Cont'd Micro  Color: Green Bilirubin: Neg mg/dL WBC/hpf: 6 - 79/KWI  Appearance: Clear Ketones: Neg mg/dL RBC/hpf: 10 - 09/BDZ  Specific Gravity: 1.025 Blood: 2+ ery/uL Bacteria: Rare (0-9/hpf)  pH: 6.5 Protein: 1+ mg/dL Cystals: NS (Not Seen)  Glucose: Neg mg/dL Urobilinogen: 0.2 mg/dL Casts: NS (Not Seen)    Nitrites: Neg Trichomonas: Not Present    Leukocyte Esterase: Trace leu/uL Mucous: Not Present      Epithelial Cells: NS (Not Seen)      Yeast: NS (Not Seen)      Sperm: Present    ASSESSMENT:      ICD-10 Details  1 GU:   BPH  w/LUTS - N40.1 Chronic, Stable  2   Incomplete bladder emptying - R39.14 Chronic, Stable  3   Nocturia - R35.1 Chronic, Stable   PLAN:           Document Letter(s):  Created for Patient: Clinical Summary         Notes:   Patient received 500 mg of cephalexin for UTI prophylaxis following cystoscopy today. Based on cystoscopy results I think patient would benefit from TURP. I discussed the risks and benefits of this procedure including but not limited to bleeding, pain, infection, need for Foley catheter, retrograde ejaculation, damage to surrounding structures, prostate regrowth. I also gave patient handout on the [facility] [employer] patient assurance program. He would like to proceed with surgery and will be scheduled.

## 2020-04-29 NOTE — Anesthesia Procedure Notes (Signed)
Procedure Name: LMA Insertion Date/Time: 04/29/2020 9:48 AM Performed by: Norva Pavlov, CRNA Pre-anesthesia Checklist: Patient identified, Emergency Drugs available, Suction available and Patient being monitored Patient Re-evaluated:Patient Re-evaluated prior to induction Oxygen Delivery Method: Circle System Utilized Preoxygenation: Pre-oxygenation with 100% oxygen Induction Type: IV induction Ventilation: Mask ventilation without difficulty LMA: LMA with gastric port inserted LMA Size: 5.0 Number of attempts: 1 Placement Confirmation: positive ETCO2 Tube secured with: Tape Dental Injury: Teeth and Oropharynx as per pre-operative assessment

## 2020-04-29 NOTE — Op Note (Signed)
Preoperative diagnosis: 1. Bladder outlet obstruction secondary to BPH  Postoperative diagnosis:  1. Bladder outlet obstruction secondary to BPH  Procedure:  1. Cystoscopy 2. Transurethral resection of the prostate  Surgeon: Kasandra Knudsen, M.D.  Anesthesia: General  Complications: None  EBL: Minimal  Specimens: 1. Prostate chips  Indication: Chris Carrillo is a patient with bladder outlet obstruction secondary to benign prostatic hyperplasia.  Patient initially underwent Rezum prostate procedure however LUTs persisted and there was evidence of median lobe still present on cystoscopy.  After reviewing the management options for treatment, he elected to proceed with the above surgical procedure(s). We have discussed the potential benefits and risks of the procedure, side effects of the proposed treatment, the likelihood of the patient achieving the goals of the procedure, and any potential problems that might occur during the procedure or recuperation. Informed consent has been obtained.  Description of procedure:  The patient was taken to the operating room and general anesthesia was induced.  The patient was placed in the dorsal lithotomy position, prepped and draped in the usual sterile fashion, and preoperative antibiotics were administered. A preoperative time-out was performed.   Cystourethroscopy was performed.  The patient's urethra was examined and bilobar prostatic hypertrophy with a median lobe.  Evidence of prior TUR surgery was present on cystoscopy however there was median lobe causing ball-valve obstruction as well as apical right lobe still present.  The bladder was then systematically examined in its entirety. There was no evidence of any bladder tumors, stones, or other mucosal pathology.  The ureteral orifices were identified and marked so as to be avoided during the procedure.  The prostate adenoma was then resected utilizing loop cautery resection with the  bipolar cutting loop.  The prostate adenoma from the bladder neck back to the verumontanum was resected beginning at the six o'clock position and then extended to include the right and left lobes of the prostate and anterior prostate. Care was taken not to resect distal to the verumontanum.   Hemostasis was then achieved with the cautery and the bladder was emptied and reinspected with no significant bleeding noted at the end of the procedure.    A 22 French three-way catheter was then placed into the bladder.  The patient appeared to tolerate the procedure well and without complications.  The patient was able to be awakened and transferred to the recovery unit in satisfactory condition.   Follow-up: The inflow port was capped and patient will be observed in the PACU.  He will be discharged home with Foley catheter and return to the office in 2 days for voiding trial.

## 2020-04-29 NOTE — Transfer of Care (Signed)
Immediate Anesthesia Transfer of Care Note  Patient: Chris Carrillo  Procedure(s) Performed: Procedure(s) (LRB): TRANSURETHRAL RESECTION OF THE PROSTATE (TURP) (N/A)  Patient Location: PACU  Anesthesia Type: General  Level of Consciousness: awake, alert  and oriented  Airway & Oxygen Therapy: Patient Spontanous Breathing and Patient connected to nasal cannula oxygen  Post-op Assessment: Report given to PACU RN and Post -op Vital signs reviewed and stable  Post vital signs: Reviewed and stable  Complications: No apparent anesthesia complications Last Vitals:  Vitals Value Taken Time  BP 133/82 04/29/20 1102  Temp 36.4 C 04/29/20 1100  Pulse 65 04/29/20 1106  Resp 15 04/29/20 1106  SpO2 100 % 04/29/20 1106  Vitals shown include unvalidated device data.  Last Pain:  Vitals:   04/29/20 1100  TempSrc:   PainSc: 8       Patients Stated Pain Goal: 2 (04/29/20 1100)  Complications: No complications documented.

## 2020-04-29 NOTE — Discharge Instructions (Signed)
Post Anesthesia Home Care Instructions  Activity: Get plenty of rest for the remainder of the day. A responsible individual must stay with you for 24 hours following the procedure.  For the next 24 hours, DO NOT: -Drive a car -Advertising copywriter -Drink alcoholic beverages -Take any medication unless instructed by your physician -Make any legal decisions or sign important papers.  Meals: Start with liquid foods such as gelatin or soup. Progress to regular foods as tolerated. Avoid greasy, spicy, heavy foods. If nausea and/or vomiting occur, drink only clear liquids until the nausea and/or vomiting subsides. Call your physician if vomiting continues.  Special Instructions/Symptoms: Your throat may feel dry or sore from the anesthesia or the breathing tube placed in your throat during surgery. If this causes discomfort, gargle with warm salt water. The discomfort should disappear within 24 hours.  If you had a scopolamine patch placed behind your ear for the management of post- operative nausea and/or vomiting:  1. The medication in the patch is effective for 72 hours, after which it should be removed.  Wrap patch in a tissue and discard in the trash. Wash hands thoroughly with soap and water. 2. You may remove the patch earlier than 72 hours if you experience unpleasant side effects which may include dry mouth, dizziness or visual disturbances. 3. Avoid touching the patch. Wash your hands with soap and water after contact with the patch.    Post transurethral resection of the prostate (TURP) instructions  Your recent prostate surgery requires very special post hospital care. Despite the fact that no skin incisions were used the area around the prostate incision is quite raw and is covered with a scab to promote healing and prevent bleeding. Certain cautions are needed to assure that the scab is not disturbed of the next 2-3 weeks while the healing proceeds.  Because the raw surface in your  prostate and the irritating effects of urine you may expect frequency of urination and/or urgency (a stronger desire to urinate) and perhaps even getting up at night more often. This will usually resolve or improve slowly over the healing period. You may see some blood in your urine over the first 6 weeks. Do not be alarmed, even if the urine was clear for a while. Get off your feet and drink lots of fluids until clearing occurs. If you start to pass clots or don't improve call us.  Catheter: (If you are discharged with a catheter.) 1. Keep your catheter secured to your leg at all times with tape or the supplied strap. 2. You may experience leakage of urine around your catheter- as long as the  catheter continues to drain, this is normal.  If your catheter stops draining  go to the ER. 3. You may also have blood in your urine, even after it has been clear for  several days; you may even pass some small blood clots or other material.  This  is normal as well.  If this happens, sit down and drink plenty of water to help  make urine to flush out your bladder.  If the blood in your urine becomes worse  after doing this, contact our office or return to the ER. 4. You may use the leg bag (small bag) during the day, but use the large bag at  night.  Diet:  You may return to your normal diet immediately. Because of the raw surface of your bladder, alcohol, spicy foods, foods high in acid and drinks with caffeine  may cause irritation or frequency and should be used in moderation. To keep your urine flowing freely and avoid constipation, drink plenty of fluids during the day (8-10 glasses). Tip: Avoid cranberry juice because it is very acidic.  Activity:  Your physical activity doesn't need to be restricted. However, if you are very active, you may see some blood in the urine. We suggest that you reduce your activity under the circumstances until the bleeding has stopped.  Bowels:  It is important to  keep your bowels regular during the postoperative period. Straining with bowel movements can cause bleeding. A bowel movement every other day is reasonable. Use a mild laxative if needed, such as milk of magnesia 2-3 tablespoons, or 2 Dulcolax tablets. Call if you continue to have problems. If you had been taking narcotics for pain, before, during or after your surgery, you may be constipated. Take a laxative if necessary.  Medication:  You should resume your pre-surgery medications unless told not to. DO NOT RESUME YOUR ASPIRIN, WARFARIN, OR OTHER BLOOD THINNER FOR 1 WEEK. In addition you may be given an antibiotic to prevent or treat infection. Antibiotics are not always necessary. All medication should be taken as prescribed until the bottles are finished unless you are having an unusual reaction to one of the drugs.     Problems you should report to [place]:  a. Fever greater than 101F. b. Heavy bleeding, or clots (see notes above about blood in urine). c. Inability to urinate. d. Drug reactions (hives, rash, nausea, vomiting, diarrhea). e. Severe burning or pain with urination that is not improving.

## 2020-04-29 NOTE — Anesthesia Preprocedure Evaluation (Signed)
Anesthesia Evaluation  Patient identified by MRN, date of birth, ID band Patient awake    Reviewed: Allergy & Precautions, NPO status , Patient's Chart, lab work & pertinent test results  Airway Mallampati: II       Dental no notable dental hx.    Pulmonary sleep apnea and Continuous Positive Airway Pressure Ventilation , former smoker,    Pulmonary exam normal        Cardiovascular hypertension, Pt. on medications Normal cardiovascular exam     Neuro/Psych negative neurological ROS     GI/Hepatic negative GI ROS, Neg liver ROS,   Endo/Other  Morbid obesity  Renal/GU negative Renal ROS  negative genitourinary   Musculoskeletal negative musculoskeletal ROS (+)   Abdominal (+) + obese,   Peds  Hematology negative hematology ROS (+)   Anesthesia Other Findings   Reproductive/Obstetrics                             Anesthesia Physical Anesthesia Plan  ASA: III  Anesthesia Plan: General   Post-op Pain Management:    Induction: Intravenous  PONV Risk Score and Plan: Ondansetron, Dexamethasone and Midazolam  Airway Management Planned: LMA  Additional Equipment: None  Intra-op Plan:   Post-operative Plan: Extubation in OR  Informed Consent: I have reviewed the patients History and Physical, chart, labs and discussed the procedure including the risks, benefits and alternatives for the proposed anesthesia with the patient or authorized representative who has indicated his/her understanding and acceptance.     Dental advisory given  Plan Discussed with: CRNA  Anesthesia Plan Comments:         Anesthesia Quick Evaluation

## 2020-04-29 NOTE — Anesthesia Postprocedure Evaluation (Signed)
Anesthesia Post Note  Patient: Chris Carrillo  Procedure(s) Performed: TRANSURETHRAL RESECTION OF THE PROSTATE (TURP) (N/A Prostate)     Patient location during evaluation: PACU Anesthesia Type: General Level of consciousness: awake Pain management: pain level controlled Vital Signs Assessment: post-procedure vital signs reviewed and stable Respiratory status: spontaneous breathing Cardiovascular status: stable Postop Assessment: no apparent nausea or vomiting Anesthetic complications: no   No complications documented.  Last Vitals:  Vitals:   04/29/20 1145 04/29/20 1200  BP: 133/83 130/80  Pulse: (!) 58 64  Resp: 17 15  Temp:    SpO2: 100% 100%    Last Pain:  Vitals:   04/29/20 1200  TempSrc:   PainSc: 0-No pain                 John F Scharlene Corn

## 2020-04-30 ENCOUNTER — Encounter (HOSPITAL_BASED_OUTPATIENT_CLINIC_OR_DEPARTMENT_OTHER): Payer: Self-pay | Admitting: Urology

## 2020-04-30 LAB — SURGICAL PATHOLOGY

## 2020-12-10 ENCOUNTER — Other Ambulatory Visit: Payer: Self-pay | Admitting: Urology

## 2021-01-08 ENCOUNTER — Other Ambulatory Visit: Payer: Self-pay

## 2021-01-08 ENCOUNTER — Encounter (HOSPITAL_BASED_OUTPATIENT_CLINIC_OR_DEPARTMENT_OTHER): Payer: Self-pay | Admitting: Urology

## 2021-01-08 NOTE — Progress Notes (Signed)
Spoke w/ via phone for pre-op interview---pt Lab needs dos----    I stat           Lab results------ekg 04-29-2020 chart/epic COVID test -----patient states asymptomatic no test needed Arrive at -------815 am 01-13-2021 NPO after MN NO Solid Food.  Clear liquids from MN until---715 am Med rec completed Medications to take morning of surgery -----none Diabetic medication -----n/a Patient instructed to bring photo id and insurance card day of surgery Patient aware to have Driver (ride ) / caregiver    for 24 hours after surgery  wife beth Patient Special Instructions -----bring bipap mask tubing  and machine and leave in car Pre-Op special Istructions -----do not use nicotine pouch for 24 hours before surgery Patient verbalized understanding of instructions that were given at this phone interview. Patient denies shortness of breath, chest pain, fever, cough at this phone interview.   Lov pulmonary dr Craige Cotta 03-21-2017 epic Sleep study 06-19-2015 epic

## 2021-01-12 NOTE — H&P (Signed)
CC/HPI: cc: BPH and ED   06/26/2020: 50 year old male who presents today for a one-month follow-up after undergoing a TURP on 03/22. He has been doing very well postoperatively. He denies any bothersome voiding complaints. He would like to discuss using tadalafil for ED. He rerpots it greatly helped him in the past.   10/02/20: 50 year old man with a history of BPH status post TURP 04/2020 as well as ED managed with daily tadalafil here for follow-up. He states his initial urinary stream was strong however it has weekend over the last month or so. He has also noticed occasional split stream. His urinary urgency has been improved however. In addition the daily tadalafil helps him get an erection but he is not able to maintain it for sexual intercourse. He has gained about 10 lb in the last few months and is working on getting back to exercise and healthy eating.    12/04/20: 50 year old man with a history of BPH status post TURP 04/2020 as well as ED managed with daily tadalafil here for follow-up. He has not really had any improvement with increasing tadalafil. He is continue to take the Flomax which she thinks helps him somewhat but his stream is still variable. He is able to drive from here [facility] without stopping but is sometimes having some cramping and weak stream.     ALLERGIES: No Allergies    MEDICATIONS: Tamsulosin Hcl 0.4 mg capsule  Hydroxyzine Hcl 25 mg tablet  Losartan-Hydrochlorothiazide 50 mg-12.5 mg tablet  Sertraline Hcl 25 mg tablet  Tadalafil 5 mg tablet 1 tablet PO Daily As Directed  Uribel 118 mg-10 mg-40.8 mg-36 mg-0.12 mg capsule 1 capsule PO TID PRN  Zoloft     GU PSH: Cystoscopy - 04/03/2020, 10/23/2019 Cystoscopy TURP - 04/29/2020 Locm 300-399Mg /Ml Iodine,1Ml - 10/09/2019 Trurl Dstrj Prst8 Tiss Rf Wv - 01/07/2020       PSH Notes: left knee surgery   NON-GU PSH: No Non-GU PSH    GU PMH: BPH w/LUTS - 10/02/2020, - 06/26/2020, - 05/15/2020, - 05/01/2020, -  04/03/2020, - 02/27/2020, - 02/12/2020, - 02/04/2020, - 01/15/2020, - 01/07/2020, - 11/21/2019, - 10/23/2019, - 09/27/2019 ED due to arterial insufficiency - 10/02/2020, - 06/26/2020, - 10/23/2019, - 09/27/2019 Weak Urinary Stream - 10/02/2020, - 01/07/2020, - 10/23/2019, - 09/27/2019 Dysuria - 05/15/2020, - 02/27/2020, - 02/20/2020, - 02/12/2020 Incomplete bladder emptying - 05/01/2020, - 04/03/2020, - 01/07/2020, - 10/23/2019, - 09/27/2019 Nocturia - 05/01/2020, - 04/03/2020, - 02/27/2020, - 09/27/2019 Urinary Urgency - 02/27/2020, - 02/20/2020, - 02/12/2020, - 10/23/2019, - 09/27/2019 Ureteral calculus - 02/20/2020, - 10/23/2019 Urinary Frequency - 02/20/2020, - 02/12/2020, - 10/23/2019, - 09/27/2019 Microscopic hematuria - 10/23/2019, - 09/27/2019 Encounter for Prostate Cancer screening - 09/27/2019 Urinary Hesitancy - 09/27/2019      PMH Notes: C-PAP   NON-GU PMH: Anxiety Arthritis Depression Hypertension Sleep Apnea    FAMILY HISTORY: Bladder Cancer - Father cardiac pacemaker - Mother Enlarged prostate - Father Heart Disease - Mother Kidney Stones - Father   SOCIAL HISTORY: Marital Status: Married Preferred Language: English; Race: White Current Smoking Status: Patient does not smoke anymore. Has not smoked since 09/09/1990. Smoked for 3 years. Smoked 1 pack per day.   Tobacco Use Assessment Completed: Used Tobacco in last 30 days? Drinks 1 drink per day.  Drinks 1 caffeinated drink per day. Has not had a blood transfusion. Patient's occupation Programmer, [employer] retired.     Notes: 1 son 1 daughter Pt dips   REVIEW OF SYSTEMS:  GU Review Male:   Patient denies erection problems, leakage of urine, hard to postpone urination, get up at night to urinate, frequent urination, penile pain, stream starts and stops, burning/ pain with urination, trouble starting your stream, and have to strain to urinate .  Gastrointestinal (Upper):   Patient denies nausea, vomiting, and indigestion/ heartburn.  Gastrointestinal  (Lower):   Patient denies diarrhea and constipation.  Constitutional:   Patient denies fever, night sweats, weight loss, and fatigue.  Skin:   Patient denies skin rash/ lesion and itching.  Eyes:   Patient denies blurred vision and double vision.  Ears/ Nose/ Throat:   Patient denies sore throat and sinus problems.  Hematologic/Lymphatic:   Patient denies swollen glands and easy bruising.  Cardiovascular:   Patient denies leg swelling and chest pains.  Respiratory:   Patient denies cough and shortness of breath.  Endocrine:   Patient denies excessive thirst.  Musculoskeletal:   Patient denies back pain and joint pain.  Neurological:   Patient denies headaches and dizziness.  Psychologic:   Patient denies depression and anxiety.   VITAL SIGNS:      12/04/2020 07:59 AM  BP 155/87 mmHg  Heart Rate 85 /min  Temperature 97.3 F / 36.2 C   MULTI-SYSTEM PHYSICAL EXAMINATION:    Constitutional: Well-nourished. No physical deformities. Normally developed. Good grooming.  Neck: Neck symmetrical, not swollen. Normal tracheal position.  Respiratory: No labored breathing, no use of accessory muscles.   Skin: No paleness, no jaundice, no cyanosis. No lesion, no ulcer, no rash.  Neurologic / Psychiatric: Oriented to time, oriented to place, oriented to person. No depression, no anxiety, no agitation.  Gastrointestinal: No rigidity  Eyes: Normal conjunctivae. Normal eyelids.  Ears, Nose, Mouth, and Throat: Left ear no scars, no lesions, no masses. Right ear no scars, no lesions, no masses. Nose no scars, no lesions, no masses. Normal hearing. Normal lips.  Musculoskeletal: Normal gait and station of head and neck.     Complexity of Data:  Records Review:   Previous Patient Records, POC Tool  Urine Test Review:   Urinalysis   09/27/19  PSA  Total PSA 2.79 ng/mL   Notes:                     04/29/2020: BUN 23, creatinine 1.1   09/27/2019: PSA 2.79   PROCEDURES:         Flexible Cystoscopy -  52000  Risks, benefits, and some of the potential complications of the procedure were discussed at length with the patient including infection, bleeding, voiding discomfort, urinary retention, fever, chills, sepsis, and others. All questions were answered. Informed consent was obtained. Sterile technique and intraurethral analgesia were used.  Meatus:  Normal size. Normal location. Normal condition.  Urethra:  Mild bulbous stricture unable to traverse cystoscope appears thin in nature.      The flexible cystoscope was placed in the urethra meatus and advanced to the bulbar stricture. The cystoscope was unable to traverse the stricture. The cystoscope was removed and the procedure was ended. The procedure was well-tolerated and without complications. Instructions were given to call the office immediately for bloody urine, difficulty urinating, urinary retention, painful or frequent urination, fever, chills, nausea, vomiting or other illness. The patient stated that he understood these instructions and would comply with them.   ASSESSMENT:      ICD-10 Details  1 GU:   BPH w/LUTS - N40.1 Chronic, Stable  2   ED due to  arterial insufficiency - N52.01 Chronic, Stable  3   Bulbar urethral stricture - N35.011 Undiagnosed New Problem   PLAN:           Document Letter(s):  Created for Patient: Clinical Summary         Notes:   1. ED: pt to buy 100mg  sildenafil. He will try 50mg  to see how it works. We will try as needed sildenafil 1 hour prior to sexual intercourse. He was counseled on possible side effects.   2. Bulbar urethral stricture: Patient noted to have stricture on cystoscopy. This is likely contributing to urinary symptoms. Discussed with patient cystoscopy with urethral balloon dilation and possible repeat TURP or bladder neck incision based on intraoperative findings. Risks and benefits discussed including but not limited to bleeding, infection, pain, recurrence, damage to surrounding  structures, need for future treatment, need for Foley catheter following the procedure, catheter discomfort. Patient has agreed and will be scheduled for surgery.

## 2021-01-13 ENCOUNTER — Encounter (HOSPITAL_BASED_OUTPATIENT_CLINIC_OR_DEPARTMENT_OTHER)
Admission: RE | Disposition: A | Discharge status: Home / Self Care | Payer: Self-pay | Source: Home / Self Care | Attending: Urology

## 2021-01-13 ENCOUNTER — Ambulatory Visit (HOSPITAL_BASED_OUTPATIENT_CLINIC_OR_DEPARTMENT_OTHER)
Admission: RE | Admit: 2021-01-13 | Discharge: 2021-01-13 | Disposition: A | Discharge status: Home / Self Care | Payer: BC Managed Care – PPO | Attending: Urology | Admitting: Urology

## 2021-01-13 ENCOUNTER — Encounter (HOSPITAL_BASED_OUTPATIENT_CLINIC_OR_DEPARTMENT_OTHER): Payer: Self-pay | Admitting: Urology

## 2021-01-13 ENCOUNTER — Ambulatory Visit (HOSPITAL_BASED_OUTPATIENT_CLINIC_OR_DEPARTMENT_OTHER): Payer: BC Managed Care – PPO | Admitting: Anesthesiology

## 2021-01-13 DIAGNOSIS — N35912 Unspecified bulbous urethral stricture, male: Secondary | ICD-10-CM | POA: Insufficient documentation

## 2021-01-13 DIAGNOSIS — N401 Enlarged prostate with lower urinary tract symptoms: Secondary | ICD-10-CM | POA: Insufficient documentation

## 2021-01-13 DIAGNOSIS — Z87891 Personal history of nicotine dependence: Secondary | ICD-10-CM | POA: Insufficient documentation

## 2021-01-13 HISTORY — DX: Male erectile dysfunction, unspecified: N52.9

## 2021-01-13 HISTORY — PX: CYSTOSCOPY WITH URETHRAL DILATATION: SHX5125

## 2021-01-13 LAB — POCT I-STAT, CHEM 8
BUN: 22 mg/dL — ABNORMAL HIGH (ref 6–20)
Calcium, Ion: 1.17 mmol/L (ref 1.15–1.40)
Chloride: 99 mmol/L (ref 98–111)
Creatinine, Ser: 1.1 mg/dL (ref 0.61–1.24)
Glucose, Bld: 106 mg/dL — ABNORMAL HIGH (ref 70–99)
HCT: 44 % (ref 39.0–52.0)
Hemoglobin: 15 g/dL (ref 13.0–17.0)
Potassium: 3.3 mmol/L — ABNORMAL LOW (ref 3.5–5.1)
Sodium: 140 mmol/L (ref 135–145)
TCO2: 25 mmol/L (ref 22–32)

## 2021-01-13 SURGERY — CYSTOSCOPY, WITH URETHRAL DILATION
Anesthesia: General | Site: Urethra

## 2021-01-13 MED ORDER — DEXAMETHASONE SODIUM PHOSPHATE 10 MG/ML IJ SOLN
INTRAMUSCULAR | Status: AC
  Filled 2021-01-13: qty 1

## 2021-01-13 MED ORDER — CEFAZOLIN IN SODIUM CHLORIDE 3-0.9 GM/100ML-% IV SOLN
3.0000 g | Freq: Once | INTRAVENOUS | Status: AC
  Administered 2021-01-13: 3 g via INTRAVENOUS

## 2021-01-13 MED ORDER — TRAMADOL HCL 50 MG PO TABS: 50.0000 mg | ORAL_TABLET | Freq: Four times a day (QID) | ORAL | 0 refills | Status: AC | PRN

## 2021-01-13 MED ORDER — PROPOFOL 10 MG/ML IV BOLUS
INTRAVENOUS | Status: DC | PRN
  Administered 2021-01-13: 200 mg via INTRAVENOUS
  Administered 2021-01-13: 50 mg via INTRAVENOUS

## 2021-01-13 MED ORDER — FENTANYL CITRATE (PF) 100 MCG/2ML IJ SOLN
INTRAMUSCULAR | Status: AC
  Filled 2021-01-13: qty 2

## 2021-01-13 MED ORDER — FENTANYL CITRATE (PF) 100 MCG/2ML IJ SOLN
INTRAMUSCULAR | Status: DC | PRN
  Administered 2021-01-13: 25 ug via INTRAVENOUS
  Administered 2021-01-13: 50 ug via INTRAVENOUS
  Administered 2021-01-13 (×2): 25 ug via INTRAVENOUS
  Administered 2021-01-13: 50 ug via INTRAVENOUS
  Administered 2021-01-13: 25 ug via INTRAVENOUS

## 2021-01-13 MED ORDER — ONDANSETRON HCL 4 MG/2ML IJ SOLN
INTRAMUSCULAR | Status: DC | PRN
  Administered 2021-01-13: 4 mg via INTRAVENOUS

## 2021-01-13 MED ORDER — KETOROLAC TROMETHAMINE 30 MG/ML IJ SOLN
INTRAMUSCULAR | Status: AC
  Filled 2021-01-13: qty 1

## 2021-01-13 MED ORDER — IOHEXOL 300 MG/ML  SOLN
INTRAMUSCULAR | Status: DC | PRN
  Administered 2021-01-13: 10 mL via URETHRAL

## 2021-01-13 MED ORDER — LACTATED RINGERS IV SOLN: INTRAVENOUS | Status: DC

## 2021-01-13 MED ORDER — CEFAZOLIN IN SODIUM CHLORIDE 3-0.9 GM/100ML-% IV SOLN
INTRAVENOUS | Status: AC
  Filled 2021-01-13: qty 100

## 2021-01-13 MED ORDER — ONDANSETRON HCL 4 MG/2ML IJ SOLN
INTRAMUSCULAR | Status: AC
  Filled 2021-01-13: qty 2

## 2021-01-13 MED ORDER — MIDAZOLAM HCL 5 MG/5ML IJ SOLN
INTRAMUSCULAR | Status: DC | PRN
  Administered 2021-01-13: 2 mg via INTRAVENOUS

## 2021-01-13 MED ORDER — KETOROLAC TROMETHAMINE 30 MG/ML IJ SOLN
INTRAMUSCULAR | Status: DC | PRN
  Administered 2021-01-13: 30 mg via INTRAVENOUS

## 2021-01-13 MED ORDER — CEPHALEXIN 250 MG PO CAPS
250.0000 mg | ORAL_CAPSULE | Freq: Every day | ORAL | 0 refills | Status: AC
Start: 2021-01-13 — End: 2021-01-16

## 2021-01-13 MED ORDER — LIDOCAINE HCL (CARDIAC) PF 100 MG/5ML IV SOSY
PREFILLED_SYRINGE | INTRAVENOUS | Status: DC | PRN
  Administered 2021-01-13: 100 mg via INTRAVENOUS

## 2021-01-13 MED ORDER — FENTANYL CITRATE (PF) 100 MCG/2ML IJ SOLN: 25.0000 ug | INTRAMUSCULAR | Status: DC | PRN

## 2021-01-13 MED ORDER — LIDOCAINE 2% (20 MG/ML) 5 ML SYRINGE
INTRAMUSCULAR | Status: AC
  Filled 2021-01-13: qty 5

## 2021-01-13 MED ORDER — DEXAMETHASONE SODIUM PHOSPHATE 4 MG/ML IJ SOLN
INTRAMUSCULAR | Status: DC | PRN
  Administered 2021-01-13: 10 mg via INTRAVENOUS

## 2021-01-13 MED ORDER — SODIUM CHLORIDE 0.9 % IR SOLN
Status: DC | PRN
  Administered 2021-01-13: 6000 mL

## 2021-01-13 MED ORDER — MIDAZOLAM HCL 2 MG/2ML IJ SOLN
INTRAMUSCULAR | Status: AC
  Filled 2021-01-13: qty 2

## 2021-01-13 MED ORDER — CEFAZOLIN SODIUM-DEXTROSE 2-4 GM/100ML-% IV SOLN: 2.0000 g | INTRAVENOUS | Status: DC

## 2021-01-13 MED ORDER — PROPOFOL 10 MG/ML IV BOLUS
INTRAVENOUS | Status: AC
  Filled 2021-01-13: qty 20

## 2021-01-13 SURGICAL SUPPLY — 26 items
BAG DRAIN URO-CYSTO SKYTR STRL (DRAIN) ×3 IMPLANT
BAG DRN RND TRDRP ANRFLXCHMBR (UROLOGICAL SUPPLIES) ×2
BAG DRN UROCATH (DRAIN) ×2
BAG URINE DRAIN 2000ML AR STRL (UROLOGICAL SUPPLIES) ×3 IMPLANT
BALLN NEPHROSTOMY (BALLOONS) ×3
BALLN OPTILUME DCB 30X5X75 (BALLOONS) ×3
BALLOON NEPHROSTOMY (BALLOONS) ×2 IMPLANT
BALLOON OPTILUME DCB 30X5X75 (BALLOONS) ×2 IMPLANT
CATH FOLEY 2W COUNCIL 20FR 5CC (CATHETERS) ×3 IMPLANT
CLOTH BEACON ORANGE TIMEOUT ST (SAFETY) ×3 IMPLANT
GLOVE SURG ENC MOIS LTX SZ6.5 (GLOVE) ×6 IMPLANT
GLOVE SURG LTX SZ6.5 (GLOVE) ×3 IMPLANT
GLOVE SURG POLYISO LF SZ7.5 (GLOVE) ×3 IMPLANT
GLOVE SURG UNDER POLY LF SZ6.5 (GLOVE) ×3 IMPLANT
GOWN STRL REUS W/TWL LRG LVL3 (GOWN DISPOSABLE) ×9 IMPLANT
GUIDEWIRE STR DUAL SENSOR (WIRE) ×3 IMPLANT
HOLDER FOLEY CATH W/STRAP (MISCELLANEOUS) ×3 IMPLANT
IV NS IRRIG 3000ML ARTHROMATIC (IV SOLUTION) ×6 IMPLANT
KIT TURNOVER CYSTO (KITS) ×3 IMPLANT
LOOP CUT BIPOLAR 24F LRG (ELECTROSURGICAL) ×3 IMPLANT
MANIFOLD NEPTUNE II (INSTRUMENTS) ×3 IMPLANT
PACK CYSTO (CUSTOM PROCEDURE TRAY) ×3 IMPLANT
SYR TOOMEY IRRIG 70ML (MISCELLANEOUS) ×3
SYRINGE TOOMEY IRRIG 70ML (MISCELLANEOUS) ×2 IMPLANT
TUBE CONNECTING 12X1/4 (SUCTIONS) ×3 IMPLANT
TUBING UROLOGY SET (TUBING) ×3 IMPLANT

## 2021-01-13 NOTE — Interval H&P Note (Signed)
History and Physical Interval Note: Also discussed use of optilume and postop limitations. Risks and benefits discussed and this was added to informed consent.   01/13/2021 10:07 AM  Chris Carrillo  has presented today for surgery, with the diagnosis of BULBAR URETHRAL STRICTURE.  The various methods of treatment have been discussed with the patient and family. After consideration of risks, benefits and other options for treatment, the patient has consented to  Procedure(s) with comments: CYSTOSCOPY WITH URETHRAL BALLOON DILATATION (N/A) - 1 HR TRANSURETHRAL RESECTION OF THE PROSTATE (TURP)- possible (N/A) as a surgical intervention.  The patient's history has been reviewed, patient examined, no change in status, stable for surgery.  I have reviewed the patient's chart and labs.  Questions were answered to the patient's satisfaction.     Shasta Chinn D Jordie Schreur

## 2021-01-13 NOTE — Anesthesia Procedure Notes (Signed)
Procedure Name: LMA Insertion Date/Time: 01/13/2021 10:27 AM Performed by: Jessica Priest, CRNA Pre-anesthesia Checklist: Patient identified, Emergency Drugs available, Suction available, Patient being monitored and Timeout performed Patient Re-evaluated:Patient Re-evaluated prior to induction Oxygen Delivery Method: Circle system utilized Preoxygenation: Pre-oxygenation with 100% oxygen Induction Type: IV induction Ventilation: Mask ventilation without difficulty LMA: LMA inserted LMA Size: 5.0 Number of attempts: 1 Airway Equipment and Method: Bite block Placement Confirmation: positive ETCO2, breath sounds checked- equal and bilateral and CO2 detector Tube secured with: Tape Dental Injury: Teeth and Oropharynx as per pre-operative assessment  Comments: Pre02 with 100 % 02  Wedge shoulder support for shoulders and foam headrest pillow ramped well

## 2021-01-13 NOTE — Progress Notes (Signed)
Indwelling urinary catheter care instructions given to patient.

## 2021-01-13 NOTE — Discharge Instructions (Addendum)
Cystoscopy with Optilume balloon patient instructions  Following a cystoscopy, a catheter (a flexible rubber tube) is sometimes left in place to empty the bladder. This may cause some discomfort or a feeling that you need to urinate. Your doctor determines the period of time that the catheter will be left in place. You may have bloody urine for two to three days (Call your doctor if the amount of bleeding increases or does not subside).  You may pass blood clots in your urine, especially if you had a biopsy. It is not unusual to pass small blood clots and have some bloody urine a couple of weeks after your cystoscopy. Again, call your doctor if the bleeding does not subside. You may have: Dysuria (painful urination) Frequency (urinating often) Urgency (strong desire to urinate)  These symptoms are common especially if medicine is instilled into the bladder or a ureteral stent is placed. Avoiding alcohol and caffeine, such as coffee, tea, and chocolate, may help relieve these symptoms. Drink plenty of water, unless otherwise instructed. Your doctor may also prescribe an antibiotic or other medicine to reduce these symptoms.  Cystoscopy results are available soon after the procedure; biopsy results usually take two to four days. Your doctor will discuss the results of your exam with you. Before you go home, you will be given specific instructions for follow-up care. Special Instructions:  *Use condom for 30 days; if childbearing age use contraception for 6 months*  If you are going home with a catheter in place do not take a tub bath until removed by your doctor.   You may resume your normal activities.   Do not drive or operate machinery if you are taking narcotic pain medicine.   Be sure to keep all follow-up appointments with your doctor.   Call Your Doctor If: The catheter is not draining You have severe pain You are unable to urinate You have a fever over 101 You have severe  bleeding          Post Anesthesia Home Care Instructions  Activity: Get plenty of rest for the remainder of the day. A responsible individual must stay with you for 24 hours following the procedure.  For the next 24 hours, DO NOT: -Drive a car -Paediatric nurse -Drink alcoholic beverages -Take any medication unless instructed by your physician -Make any legal decisions or sign important papers.  Meals: Start with liquid foods such as gelatin or soup. Progress to regular foods as tolerated. Avoid greasy, spicy, heavy foods. If nausea and/or vomiting occur, drink only clear liquids until the nausea and/or vomiting subsides. Call your physician if vomiting continues.  Special Instructions/Symptoms: Your throat may feel dry or sore from the anesthesia or the breathing tube placed in your throat during surgery. If this causes discomfort, gargle with warm salt water. The discomfort should disappear within 24 hours.  Indications for Insertion or Continuance of Catheter Explanations/Examples  Acute urinary retention (I&O Cath for 24 hrs prior to catheter insertion - Inpatient Only) (CDC, 2009; SHEA, 2008)  Inability to void adequately or inability to void at all When possible, attempt voiding trial by removing indwelling urinary catheter on day 3 after placement   Bladder outlet obstruction / other urologic reason (CDC, 2009) May be due to anatomical abnormalities or strictures Bleeding/blood clots/gross hematuria/stones Patient may have a specialty catheter   Chemically paralyzed patients Not all ventilated patients require an indwelling urinary catheter   Chronic catheter use  Patient utilizes indwelling urinary catheter at home or  facility, usually due to urological dysfunction   End of life comfort care Improve comfort/end of life care if needed; if appropriate, check with the patient to see what is most comfortable for them Use for Palliative Care patients if routine toileting is  compromised by pain, breathing difficulty, or immobility All Do Not Resuscitate (DNR) patients do not require a urinary catheter   Epidural for laboring patient Utilize indwelling urinary catheter to drain the bladder for a laboring patient while an epidural is in place   Healing of stage 3 or 4 pressure injuries AND incontinence (CDC, 2009)  To assist in healing of open sacral/perineal pressure injuries in incontinent patients If appropriate, attempt alternative devices first (male external catheter, male external catheter, condom catheter)   Peri-operative use for selective surgical procedure - not to exceed 24 hours post-op (IHI, 2009; CDC 2009)  Use urinary catheters in operative patients only as necessary rather than routinely Renal/urologic/gynecologic surgery Anticipated prolonged duration of surgery Large volume of fluids/diuretics in surgery Need for intraoperative monitoring of urine output For operative patients who have an indication for an indwelling catheter, remove the catheter as soon as possible postoperatively, preferably within 24 hours (POD 1) unless otherwise ordered or there are appropriate indications for continued use   Therapy based on hourly urine output monitoring and documentation for critical condition (NOT STRICT I&O)  Not all patients on IV diuretics require an indwelling catheter Consider alternatives (bedside commode, bedpan, urinal, male external catheter, male external catheter, condom catheter) Need for accurate measurements of urinary output in critically ill patients (CDC, 2009)   Unstable critically ill patients first 24-48 hours  Targeted Temperature Management Titrating vasoactive medications and/or large volume resuscitation (anticipated ongoing volumes >38ml/kg) Acute increase in ICP >18mmHg Decompensation related to movement (Example: Respiratory or hemodynamic instability with turning)   Unstable spinal/crush injuries/Multisystem Trauma  Patients requiring prolonged immobilization; cannot be moved because of instability Potentially unstable thoracic or lumbar spine Movement intolerance (spinal cord injury first 48 hours; consider longer if autonomic dysreflexia, injury T6 or higher)   CDC - Centers for Disease Risk analyst - Society for Healthcare Epidemiology of [employer] IHI - Institute for Health Improvement

## 2021-01-13 NOTE — Anesthesia Preprocedure Evaluation (Addendum)
Anesthesia Evaluation  Patient identified by MRN, date of birth, ID band Patient awake    Reviewed: Allergy & Precautions, NPO status , Patient's Chart, lab work & pertinent test results  Airway Mallampati: II       Dental   Pulmonary sleep apnea , former smoker,    breath sounds clear to auscultation       Cardiovascular hypertension,  Rhythm:Regular Rate:Normal     Neuro/Psych negative neurological ROS     GI/Hepatic negative GI ROS, Neg liver ROS,   Endo/Other  negative endocrine ROS  Renal/GU negative Renal ROS     Musculoskeletal   Abdominal   Peds  Hematology negative hematology ROS (+)   Anesthesia Other Findings   Reproductive/Obstetrics                             Anesthesia Physical Anesthesia Plan  ASA: 3  Anesthesia Plan: General   Post-op Pain Management:    Induction: Intravenous  PONV Risk Score and Plan: 3 and Ondansetron, Dexamethasone and Midazolam  Airway Management Planned: LMA  Additional Equipment:   Intra-op Plan:   Post-operative Plan: Extubation in OR  Informed Consent: I have reviewed the patients History and Physical, chart, labs and discussed the procedure including the risks, benefits and alternatives for the proposed anesthesia with the patient or authorized representative who has indicated his/her understanding and acceptance.     Dental advisory given  Plan Discussed with: CRNA and Anesthesiologist  Anesthesia Plan Comments:         Anesthesia Quick Evaluation

## 2021-01-13 NOTE — Transfer of Care (Signed)
Immediate Anesthesia Transfer of Care Note  Patient: Chris Carrillo  Procedure(s) Performed: Procedure(s) (LRB): CYSTOSCOPY WITH URETHRAL BALLOON DILATATION, OPTILUME DILATATION (N/A)  Patient Location: PACU  Anesthesia Type: General  Level of Consciousness: awake, sedated, patient cooperative and responds to stimulation  Airway & Oxygen Therapy: Patient Spontanous Breathing and Patient connected to FM 02 - HOB elevated   Post-op Assessment: Report given to PACU RN, Post -op Vital signs reviewed and stable and Patient moving all extremities  Post vital signs: Reviewed and stable  Complications: No apparent anesthesia complications

## 2021-01-13 NOTE — Op Note (Signed)
Operative Note  Preoperative diagnosis:  1.  Anterior urethral stricture  Postoperative diagnosis: 1.  Anterior urethral stricture  Procedure(s): 1.  Diagnostic cystoscopy, urethral balloon dilation with Optilume dilator  Surgeon: Jacalyn Lefevre, MD  Assistants:  None  Anesthesia:  General  Complications:  None  EBL:  10 cc  Specimens: 1. none  Drains/Catheters: 1.  20Fr council tip foley  Intraoperative findings:   Anterior (penile) urethral approx 2 cm - not contiguous with different rings Posterior urethra with evidence of prior TUR, bladder neck open, no significant regrowth  Bilateral orthotopic ureteral orifices Moderate trabeculation No bladder masses  Indication:  Chris Carrillo is a 50 y.o. male with with a history of BPH with LUTS who previously underwent TURP with good results.  He had noticeably slower stream over the last few months and diagnostic cystoscopy showed penile urethral stricture.  Description of procedure: After risks and benefits of the procedure were discussed with the patient informed consent was obtained.  The patient taken to the operating room placed in supine position.  Anesthesia was induced antibiotics were administered.  The patient was prepped and draped in usual sterile fashion.  A timeout was performed.  A 23 French rigid cystoscope was placed in the urethral meatus and advanced into the penile urethra at which point the stricture was seen.  The stricture appeared to be approximately 2 cm with narrow rings not contiguous through the whole 2 cm.  The decision was made to proceed with urethral balloon dilation.  First this was done with the Ultrex balloon dilator over a wire.  A 0.38 sensor wire was placed through the cystoscope and into the bladder.  It was confirmed fluoroscopic guidance.  The Ultrex balloon dilator was then placed over the wire and inflated to 16 ATM.  After 1 minute this was deflated and removed.  Diagnostic  cystoscopy then took place over the wire.  The penile urethra was still noted to be somewhat tight but the scope could traverse into the bladder.  Findings noted above.  The prostatic urethra remained patent.  Next the operative room balloon dilator was placed over the same wire.  This was then inflated to 12 ATM according to insert instructions.  This was kept in place for 4 to 5 minutes.  It was then deflated and removed.  A 20 [place] council tip Foley was then placed over the wire and 10 cc sterile water was used to inflate the balloon.  The patient was awakened from anesthesia and transferred the PACU in stable condition.    Plan:   Continue foley for 3 days.  Postoperative limitations discussed with patient.

## 2021-01-13 NOTE — Anesthesia Postprocedure Evaluation (Signed)
Anesthesia Post Note  Patient: Chris Carrillo  Procedure(s) Performed: CYSTOSCOPY WITH URETHRAL BALLOON DILATATION, OPTILUME DILATATION (Urethra)     Patient location during evaluation: PACU Anesthesia Type: General Level of consciousness: awake Pain management: pain level controlled Vital Signs Assessment: post-procedure vital signs reviewed and stable Respiratory status: spontaneous breathing Cardiovascular status: stable Postop Assessment: no apparent nausea or vomiting Anesthetic complications: no   No notable events documented.  Last Vitals:  Vitals:   01/13/21 1200 01/13/21 1222  BP:  137/79  Pulse: 64 66  Resp: 17 16  Temp: 36.8 C 37.2 C  SpO2: 96% 98%    Last Pain:  Vitals:   01/13/21 1222  TempSrc:   PainSc: 0-No pain                 Tniya Bowditch

## 2021-01-14 ENCOUNTER — Encounter (HOSPITAL_BASED_OUTPATIENT_CLINIC_OR_DEPARTMENT_OTHER): Payer: Self-pay | Admitting: Urology
